# Patient Record
Sex: Male | Born: 1990
Health system: Southern US, Community
[De-identification: ages and names within clinical notes are randomized; demographics above are authoritative.]

## PROBLEM LIST (undated history)

## (undated) DIAGNOSIS — F419 Anxiety disorder, unspecified: Secondary | ICD-10-CM

## (undated) DIAGNOSIS — M1990 Unspecified osteoarthritis, unspecified site: Secondary | ICD-10-CM

## (undated) DIAGNOSIS — T7840XA Allergy, unspecified, initial encounter: Secondary | ICD-10-CM

## (undated) DIAGNOSIS — F32A Depression, unspecified: Secondary | ICD-10-CM

## (undated) DIAGNOSIS — K219 Gastro-esophageal reflux disease without esophagitis: Secondary | ICD-10-CM

## (undated) HISTORY — DX: Anxiety disorder, unspecified: F41.9

## (undated) HISTORY — DX: Unspecified osteoarthritis, unspecified site: M19.90

## (undated) HISTORY — DX: Depression, unspecified: F32.A

## (undated) HISTORY — DX: Gastro-esophageal reflux disease without esophagitis: K21.9

## (undated) HISTORY — DX: Allergy, unspecified, initial encounter: T78.40XA

---
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
## 2008-06-18 ENCOUNTER — Emergency Department (HOSPITAL_COMMUNITY): Admission: EM | Admit: 2008-06-18 | Discharge: 2008-06-18 | Payer: Self-pay | Admitting: Emergency Medicine

## 2011-06-10 LAB — DIFFERENTIAL
Basophils Absolute: 0
Basophils Relative: 0
Eosinophils Absolute: 0.1
Monocytes Relative: 6
Neutro Abs: 4.4
Neutrophils Relative %: 67

## 2011-06-10 LAB — COMPREHENSIVE METABOLIC PANEL
ALT: 16
Alkaline Phosphatase: 54
BUN: 8
CO2: 27
Chloride: 103
Glucose, Bld: 95
Potassium: 4
Sodium: 140
Total Bilirubin: 1.1
Total Protein: 7

## 2011-06-10 LAB — CBC
HCT: 53.5 — ABNORMAL HIGH
Hemoglobin: 17.5 — ABNORMAL HIGH
RBC: 6.09 — ABNORMAL HIGH
RDW: 14.1
WBC: 6.5

## 2011-06-10 LAB — RAPID URINE DRUG SCREEN, HOSP PERFORMED
Cocaine: NOT DETECTED
Tetrahydrocannabinol: POSITIVE — AB

## 2011-06-10 LAB — ETHANOL: Alcohol, Ethyl (B): <5

## 2013-04-26 ENCOUNTER — Encounter (HOSPITAL_BASED_OUTPATIENT_CLINIC_OR_DEPARTMENT_OTHER): Payer: Self-pay | Admitting: *Deleted

## 2013-04-26 ENCOUNTER — Emergency Department (HOSPITAL_BASED_OUTPATIENT_CLINIC_OR_DEPARTMENT_OTHER): Payer: Self-pay

## 2013-04-26 ENCOUNTER — Emergency Department (HOSPITAL_BASED_OUTPATIENT_CLINIC_OR_DEPARTMENT_OTHER)
Admission: EM | Admit: 2013-04-26 | Discharge: 2013-04-26 | Disposition: A | Discharge status: Home / Self Care | Payer: Self-pay | Attending: Emergency Medicine | Admitting: Emergency Medicine

## 2013-04-26 DIAGNOSIS — R209 Unspecified disturbances of skin sensation: Secondary | ICD-10-CM | POA: Insufficient documentation

## 2013-04-26 DIAGNOSIS — F17200 Nicotine dependence, unspecified, uncomplicated: Secondary | ICD-10-CM | POA: Insufficient documentation

## 2013-04-26 DIAGNOSIS — S51009A Unspecified open wound of unspecified elbow, initial encounter: Secondary | ICD-10-CM | POA: Insufficient documentation

## 2013-04-26 DIAGNOSIS — Y92410 Unspecified street and highway as the place of occurrence of the external cause: Secondary | ICD-10-CM | POA: Insufficient documentation

## 2013-04-26 DIAGNOSIS — S51011A Laceration without foreign body of right elbow, initial encounter: Secondary | ICD-10-CM

## 2013-04-26 DIAGNOSIS — IMO0002 Reserved for concepts with insufficient information to code with codable children: Secondary | ICD-10-CM | POA: Insufficient documentation

## 2013-04-26 DIAGNOSIS — Y9389 Activity, other specified: Secondary | ICD-10-CM | POA: Insufficient documentation
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
## 2014-03-12 ENCOUNTER — Emergency Department (HOSPITAL_BASED_OUTPATIENT_CLINIC_OR_DEPARTMENT_OTHER)
Admission: EM | Admit: 2014-03-12 | Discharge: 2014-03-12 | Disposition: A | Discharge status: Home / Self Care | Payer: Self-pay | Attending: Emergency Medicine | Admitting: Emergency Medicine

## 2014-03-12 ENCOUNTER — Encounter (HOSPITAL_BASED_OUTPATIENT_CLINIC_OR_DEPARTMENT_OTHER): Payer: Self-pay | Admitting: Emergency Medicine

## 2014-03-12 ENCOUNTER — Emergency Department (HOSPITAL_BASED_OUTPATIENT_CLINIC_OR_DEPARTMENT_OTHER): Payer: Self-pay

## 2014-03-12 DIAGNOSIS — F17200 Nicotine dependence, unspecified, uncomplicated: Secondary | ICD-10-CM | POA: Insufficient documentation

## 2014-03-12 DIAGNOSIS — J039 Acute tonsillitis, unspecified: Secondary | ICD-10-CM | POA: Insufficient documentation

## 2014-03-12 LAB — RAPID STREP SCREEN (MED CTR MEBANE ONLY): STREPTOCOCCUS, GROUP A SCREEN (DIRECT): NEGATIVE

## 2014-03-12 MED ORDER — PENICILLIN V POTASSIUM 500 MG PO TABS: 500.0000 mg | ORAL_TABLET | Freq: Four times a day (QID) | ORAL | Status: AC

## 2014-03-12 NOTE — ED Provider Notes (Signed)
CSN: [id number]     Arrival date & time 03/12/14  1941 History   First MD Initiated Contact with Patient 03/12/14 2230     Chief Complaint  Patient presents with  . Sore Throat     (Consider location/radiation/quality/duration/timing/severity/associated sxs/prior Treatment) Patient is a 23 y.o. male presenting with pharyngitis. The history is provided by the patient. No language interpreter was used.  Sore Throat This is a new problem. Episode onset: 4 days. The problem occurs constantly. The problem has been gradually worsening. Associated symptoms include a sore throat. Nothing aggravates the symptoms. He has tried nothing for the symptoms. The treatment provided moderate relief.    History reviewed. No pertinent past medical history. History reviewed. No pertinent past surgical history. History reviewed. No pertinent family history. History  Substance Use Topics  . Smoking status: Current Every Day Smoker  . Smokeless tobacco: Not on file  . Alcohol Use: Yes    Review of Systems  HENT: Positive for sore throat.   All other systems reviewed and are negative.     Allergies  Review of patient's allergies indicates no known allergies.  Home Medications   Prior to Admission medications   Not on File   BP 142/63  Pulse 69  Temp(Src) 98.5 F (36.9 C) (Oral)  Resp 18  Ht 6\' 3"  (1.905 m)  Wt 245 lb (111.131 kg)  BMI 30.62 kg/m2  SpO2 100% Physical Exam  Nursing note and vitals reviewed. Constitutional: He is oriented to person, place, and time. He appears well-developed and well-nourished.  HENT:  Head: Normocephalic and atraumatic.  Mouth/Throat: Oropharyngeal exudate present.  Swollen bilat tonsils  Eyes: EOM are normal. Pupils are equal, round, and reactive to light.  Neck: Normal range of motion.  Cardiovascular: Normal rate.   Pulmonary/Chest: Effort normal.  Abdominal: He exhibits no distension.  Musculoskeletal: Normal range of motion.  Neurological: He  is alert and oriented to person, place, and time.  Skin: Skin is warm.  Psychiatric: He has a normal mood and affect.    ED Course  Procedures (including critical care time) Labs Review Labs Reviewed  RAPID STREP SCREEN  CULTURE, GROUP A STREP    Imaging Review Dg Chest 2 View  03/12/2014   CLINICAL DATA:  SORE THROAT  EXAM: CHEST  2 VIEW  COMPARISON:  None.  FINDINGS: The heart size and mediastinal contours are within normal limits. Both lungs are clear. The visualized skeletal structures are unremarkable.  IMPRESSION: No active cardiopulmonary disease.   Electronically Signed   By: Salome HolmesHector  Cooper M.D.   On: 03/12/2014 20:04     EKG Interpretation None      MDM   Final diagnoses:  Tonsillitis    Decadron and pcn     Elson AreasLeslie K Sofia, PA-C 03/12/14 2300  Lonia SkinnerLeslie K CleatonSofia, [place] JerseyPA-C 03/12/14 2300

## 2014-03-12 NOTE — ED Notes (Signed)
Pt reports sore throat, and cough since Thursday.

## 2014-03-12 NOTE — Discharge Instructions (Signed)
Tonsillitis Tonsillitis is an infection of the throat that causes the tonsils to become red, tender, and swollen. Tonsils are collections of lymphoid tissue at the back of the throat. Each tonsil has crevices (crypts). Tonsils help fight nose and throat infections and keep infection from spreading to other parts of the body for the first 18 months of life.  CAUSES Sudden (acute) tonsillitis is usually caused by infection with streptococcal bacteria. Long-lasting (chronic) tonsillitis occurs when the crypts of the tonsils become filled with pieces of food and bacteria, which makes it easy for the tonsils to become repeatedly infected. SYMPTOMS  Symptoms of tonsillitis include:  A sore throat, with possible difficulty swallowing.  White patches on the tonsils.  Fever.  Tiredness.  New episodes of snoring during sleep, when you did not snore before.  Small, foul-smelling, yellowish-white pieces of material (tonsilloliths) that you occasionally cough up or spit out. The tonsilloliths can also cause you to have bad breath. DIAGNOSIS Tonsillitis can be diagnosed through a physical exam. Diagnosis can be confirmed with the results of lab tests, including a throat culture. TREATMENT  The goals of tonsillitis treatment include the reduction of the severity and duration of symptoms and prevention of associated conditions. Symptoms of tonsillitis can be improved with the use of steroids to reduce the swelling. Tonsillitis caused by bacteria can be treated with antibiotic medicines. Usually, treatment with antibiotic medicines is started before the cause of the tonsillitis is known. However, if it is determined that the cause is not bacterial, antibiotic medicines will not treat the tonsillitis. If attacks of tonsillitis are severe and frequent, your health care provider may recommend surgery to remove the tonsils (tonsillectomy). HOME CARE INSTRUCTIONS   Rest as much as possible and get plenty of  sleep.  Drink plenty of fluids. While the throat is very sore, eat soft foods or liquids, such as sherbet, soups, or instant breakfast drinks.  Eat frozen ice pops.  Gargle with a warm or cold liquid to help soothe the throat. Mix 1/4 teaspoon of salt and 1/4 teaspoon of baking soda in in 8 oz of water. SEEK MEDICAL CARE IF:   Large, tender lumps develop in your neck.  A rash develops.  A green, yellow-brown, or bloody substance is coughed up.  You are unable to swallow liquids or food for 24 hours.  You notice that only one of the tonsils is swollen. SEEK IMMEDIATE MEDICAL CARE IF:   You develop any new symptoms such as vomiting, severe headache, stiff neck, chest pain, or trouble breathing or swallowing.  You have severe throat pain along with drooling or voice changes.  You have severe pain, unrelieved with recommended medications.  You are unable to fully open the mouth.  You develop redness, swelling, or severe pain anywhere in the neck.  You have a fever. MAKE SURE YOU:   Understand these instructions.  Will watch your condition.  Will get help right away if you are not doing well or get worse. Document Released: 06/04/2005 Document Revised: 08/30/2013 Document Reviewed: 02/11/2013 Exit[employer] Patient Information 2015 [employer]. This information is not intended to replace advice given to you by your health care provider. Make sure you discuss any questions you have with your health care provider.  

## 2014-03-13 NOTE — ED Provider Notes (Signed)
Medical screening examination/treatment/procedure(s) were performed by non-physician practitioner and as supervising physician I was immediately available for consultation/collaboration.  Shanna CiscoMegan E Docherty, MD 03/13/14 2028

## 2014-03-14 LAB — CULTURE, GROUP A STREP

## 2016-10-29 ENCOUNTER — Encounter (HOSPITAL_BASED_OUTPATIENT_CLINIC_OR_DEPARTMENT_OTHER): Payer: Self-pay

## 2016-10-29 ENCOUNTER — Emergency Department (HOSPITAL_BASED_OUTPATIENT_CLINIC_OR_DEPARTMENT_OTHER): Payer: Worker's Compensation

## 2016-10-29 ENCOUNTER — Emergency Department (HOSPITAL_BASED_OUTPATIENT_CLINIC_OR_DEPARTMENT_OTHER)
Admission: EM | Admit: 2016-10-29 | Discharge: 2016-10-30 | Disposition: A | Discharge status: Home / Self Care | Payer: Worker's Compensation | Attending: Emergency Medicine | Admitting: Emergency Medicine

## 2016-10-29 DIAGNOSIS — W228XXA Striking against or struck by other objects, initial encounter: Secondary | ICD-10-CM | POA: Insufficient documentation

## 2016-10-29 DIAGNOSIS — S0990XA Unspecified injury of head, initial encounter: Secondary | ICD-10-CM | POA: Insufficient documentation

## 2016-10-29 DIAGNOSIS — Y990 Civilian activity done for income or pay: Secondary | ICD-10-CM | POA: Insufficient documentation

## 2016-10-29 DIAGNOSIS — Z87891 Personal history of nicotine dependence: Secondary | ICD-10-CM | POA: Insufficient documentation

## 2016-10-29 DIAGNOSIS — Y9389 Activity, other specified: Secondary | ICD-10-CM | POA: Diagnosis not present

## 2016-10-29 DIAGNOSIS — Y929 Unspecified place or not applicable: Secondary | ICD-10-CM | POA: Insufficient documentation
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
## 2016-10-29 NOTE — ED Triage Notes (Signed)
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

## 2016-10-30 MED ORDER — NAPROXEN 250 MG PO TABS
500.0000 mg | ORAL_TABLET | Freq: Once | ORAL | Status: AC
  Administered 2016-10-30: 500 mg via ORAL
  Filled 2016-10-30: qty 2

## 2017-01-30 ENCOUNTER — Emergency Department (HOSPITAL_BASED_OUTPATIENT_CLINIC_OR_DEPARTMENT_OTHER): Payer: 59

## 2017-01-30 ENCOUNTER — Emergency Department (HOSPITAL_BASED_OUTPATIENT_CLINIC_OR_DEPARTMENT_OTHER)
Admission: EM | Admit: 2017-01-30 | Discharge: 2017-01-30 | Disposition: A | Discharge status: Home / Self Care | Payer: 59 | Attending: Emergency Medicine | Admitting: Emergency Medicine

## 2017-01-30 ENCOUNTER — Encounter (HOSPITAL_BASED_OUTPATIENT_CLINIC_OR_DEPARTMENT_OTHER): Payer: Self-pay

## 2017-01-30 DIAGNOSIS — Y9367 Activity, basketball: Secondary | ICD-10-CM | POA: Diagnosis not present

## 2017-01-30 DIAGNOSIS — M79602 Pain in left arm: Secondary | ICD-10-CM | POA: Diagnosis present

## 2017-01-30 DIAGNOSIS — Z87891 Personal history of nicotine dependence: Secondary | ICD-10-CM | POA: Insufficient documentation

## 2017-01-30 MED ORDER — ACETAMINOPHEN 500 MG PO TABS
1000.0000 mg | ORAL_TABLET | Freq: Once | ORAL | Status: AC
  Administered 2017-01-30: 1000 mg via ORAL
  Filled 2017-01-30: qty 2

## 2017-01-30 MED ORDER — MELOXICAM 15 MG PO TABS: 15.0000 mg | ORAL_TABLET | Freq: Every day | ORAL | 0 refills | Status: DC
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
## 2017-01-30 NOTE — ED Notes (Signed)
Patient transported to X-ray 

## 2017-01-30 NOTE — ED Triage Notes (Signed)
Pt reports he was playing basketball yesterday around 1830 when he fell, resulting in left elbow and left forearm pain. Naproxen taken at 2200, with no relief. CMS intact, area warm and dry, ROM to left elbow is limited.

## 2017-11-08 ENCOUNTER — Emergency Department (HOSPITAL_BASED_OUTPATIENT_CLINIC_OR_DEPARTMENT_OTHER): Payer: 59

## 2017-11-08 ENCOUNTER — Emergency Department (HOSPITAL_BASED_OUTPATIENT_CLINIC_OR_DEPARTMENT_OTHER)
Admission: EM | Admit: 2017-11-08 | Discharge: 2017-11-08 | Disposition: A | Discharge status: Home / Self Care | Payer: 59 | Attending: Emergency Medicine | Admitting: Emergency Medicine

## 2017-11-08 ENCOUNTER — Other Ambulatory Visit: Payer: Self-pay

## 2017-11-08 ENCOUNTER — Encounter (HOSPITAL_BASED_OUTPATIENT_CLINIC_OR_DEPARTMENT_OTHER): Payer: Self-pay | Admitting: Adult Health

## 2017-11-08 DIAGNOSIS — S99911A Unspecified injury of right ankle, initial encounter: Secondary | ICD-10-CM | POA: Diagnosis present

## 2017-11-08 DIAGNOSIS — X503XXA Overexertion from repetitive movements, initial encounter: Secondary | ICD-10-CM | POA: Diagnosis not present

## 2017-11-08 DIAGNOSIS — Z79899 Other long term (current) drug therapy: Secondary | ICD-10-CM | POA: Insufficient documentation

## 2017-11-08 DIAGNOSIS — S93401A Sprain of unspecified ligament of right ankle, initial encounter: Secondary | ICD-10-CM | POA: Diagnosis not present

## 2017-11-08 DIAGNOSIS — Y990 Civilian activity done for income or pay: Secondary | ICD-10-CM | POA: Diagnosis not present

## 2017-11-08 DIAGNOSIS — Y929 Unspecified place or not applicable: Secondary | ICD-10-CM | POA: Insufficient documentation

## 2017-11-08 DIAGNOSIS — Z87891 Personal history of nicotine dependence: Secondary | ICD-10-CM | POA: Diagnosis not present

## 2017-11-08 DIAGNOSIS — Y9389 Activity, other specified: Secondary | ICD-10-CM | POA: Diagnosis not present

## 2017-11-08 MED ORDER — IBUPROFEN 400 MG PO TABS
600.0000 mg | ORAL_TABLET | Freq: Once | ORAL | Status: AC
  Administered 2017-11-08: 600 mg via ORAL
  Filled 2017-11-08: qty 1

## 2017-11-08 NOTE — ED Provider Notes (Signed)
[facility] EMERGENCY DEPARTMENT Provider Note   CSN: [id number] Arrival date & time: 11/08/17  1137     History   Chief Complaint Chief Complaint  Patient presents with  . Ankle Pain    HPI  Roy Morris is a 27 y.o. Male who is otherwise healthy, presents to the ED for evaluation of right ankle pain and swelling since Thursday.  Patient reports he was at work and twisted his ankle while loading a truck, since then he has had pain over the top of the foot and the lateral malleolus.  Pain is constant and aching, no improvement since onset, does report some slight improvement in swelling.  He reports pain primarily with weightbearing and movement, patient has not taken anything for the pain prior to arrival, no other alleviating or aggravating factors.  No numbness or tingling, no pain at the knee.  He has not been using ice or elevation at all.      History reviewed. No pertinent past medical history.  There are no active problems to display for this patient.   History reviewed. No pertinent surgical history.     Home Medications    Prior to Admission medications   Medication Sig Start Date End Date Taking? Authorizing Provider  meloxicam (MOBIC) 15 MG tablet Take 1 tablet (15 mg total) by mouth daily. 01/30/17   Palumbo, April, MD    Family History History reviewed. No pertinent family history.  Social History Social History   Tobacco Use  . Smoking status: Former Games developermoker  . Smokeless tobacco: Never Used  Substance Use Topics  . Alcohol use: Yes    Comment: occ  . Drug use: No     Allergies   Patient has no known allergies.   Review of Systems Review of Systems  Constitutional: Negative for chills and fever.  Musculoskeletal: Positive for arthralgias and joint swelling.  Skin: Negative for color change, rash and wound.  Neurological: Negative for weakness and numbness.     Physical Exam Updated Vital Signs BP 129/69 (BP Location: Right  Arm)   Pulse (!) 58   Temp 98.3 F (36.8 C) (Oral)   Resp 18   Ht 6\' 3"  (1.905 m)   Wt 113.2 kg (249 lb 9 oz)   SpO2 100%   BMI 31.19 kg/m   Physical Exam  Constitutional: He appears well-developed and well-nourished. No distress.  HENT:  Head: Normocephalic and atraumatic.  Eyes: Right eye exhibits no discharge. Left eye exhibits no discharge.  Pulmonary/Chest: Effort normal. No respiratory distress.  Musculoskeletal:  Mild swelling noted over the top of the foot and the lateral malleolus, tenderness to palpation in the distribution as well no erythema or warmth, range of motion intact with some discomfort, 5/5 strength with dorsi and plantar flexion, 2+ DP and TP pulses with good capillary refill and sensation intact, normal knee exam.  Neurological: He is alert. Coordination normal.  Skin: Skin is warm and dry. Capillary refill takes less than 2 seconds. He is not diaphoretic.  Psychiatric: He has a normal mood and affect. His behavior is normal.  Nursing note and vitals reviewed.    ED Treatments / Results  Labs (all labs ordered are listed, but only abnormal results are displayed) Labs Reviewed - No data to display  EKG  EKG Interpretation None       Radiology Dg Ankle Complete Right  Result Date: 11/08/2017 CLINICAL DATA:  Ankle pain, status post injury EXAM: RIGHT ANKLE - COMPLETE 3+  VIEW COMPARISON:  None. FINDINGS: There is no evidence of fracture, dislocation, or joint effusion. There is no evidence of arthropathy or other focal bone abnormality. Soft tissues are unremarkable. IMPRESSION: No acute osseous injury of the right ankle. Electronically Signed   By: Elige Ko   On: 11/08/2017 12:02    Procedures Procedures (including critical care time)  Medications Ordered in ED Medications - No data to display   Initial Impression / Assessment and Plan / ED Course  I have reviewed the triage vital signs and the nursing notes.  Pertinent labs & imaging  results that were available during my care of the patient were reviewed by me and considered in my medical decision making (see chart for details).  Presentation consistent with ankle sprain. Tenderness and swelling over medial malleolus, pt is neurovascularly intact, and x-ray negative for fracture, and shows ankle mortise is intact. Pain treated in the ED. Pt placed in ASO brace and provided crutches, ambulated without difficulty. Pt stable for discharge home with ibuprofen for pain. Pt to follow-up with ortho in one week if symptoms not improving. Return precautions discussed, Pt expresses understanding and agrees with plan.   Final Clinical Impressions(s) / ED Diagnoses   Final diagnoses:  Sprain of right ankle, unspecified ligament, initial encounter    ED Discharge Orders    None       Dartha Lodge, [place] 11/08/17 1223    Rolan Bucco, MD 11/08/17 [phone]

## 2017-11-08 NOTE — ED Triage Notes (Signed)
Presents with right ankle pain and swelling since Thursday. Not doing RICE at home. RIGHt ankle twisted while working. Swelling noted. No medications. CMS intact.

## 2017-11-08 NOTE — Discharge Instructions (Signed)
You have an ankle sprain, x-ray shows no evidence of fracture.  Please use ibuprofen and Tylenol, elevate and ice the ankle, use ankle brace and crutches, the more you stay off of the ankle to sooner will heal.  If not improving you may follow-up with Dr. Norton BlizzardShane Hudnall with sports medicine.  If you have severe increased pain in the ankle, swelling, redness, warmth fevers or chills or other new or concerning symptoms he may return to the emergency department for reevaluation.

## 2018-01-07 ENCOUNTER — Encounter (HOSPITAL_BASED_OUTPATIENT_CLINIC_OR_DEPARTMENT_OTHER): Payer: Self-pay | Admitting: *Deleted

## 2018-01-07 ENCOUNTER — Emergency Department (HOSPITAL_BASED_OUTPATIENT_CLINIC_OR_DEPARTMENT_OTHER)
Admission: EM | Admit: 2018-01-07 | Discharge: 2018-01-07 | Disposition: A | Discharge status: Home / Self Care | Payer: 59 | Attending: Emergency Medicine | Admitting: Emergency Medicine

## 2018-01-07 ENCOUNTER — Other Ambulatory Visit: Payer: Self-pay

## 2018-01-07 DIAGNOSIS — L0231 Cutaneous abscess of buttock: Secondary | ICD-10-CM | POA: Insufficient documentation

## 2018-01-07 DIAGNOSIS — Z79899 Other long term (current) drug therapy: Secondary | ICD-10-CM | POA: Insufficient documentation

## 2018-01-07 DIAGNOSIS — Z87891 Personal history of nicotine dependence: Secondary | ICD-10-CM | POA: Insufficient documentation

## 2018-01-07 DIAGNOSIS — Z5321 Procedure and treatment not carried out due to patient leaving prior to being seen by health care provider: Secondary | ICD-10-CM | POA: Insufficient documentation

## 2018-01-07 MED ORDER — OXYCODONE-ACETAMINOPHEN 5-325 MG PO TABS
1.0000 | ORAL_TABLET | Freq: Once | ORAL | Status: AC
  Administered 2018-01-07: 1 via ORAL
  Filled 2018-01-07: qty 1

## 2018-01-07 MED ORDER — LIDOCAINE-EPINEPHRINE (PF) 2 %-1:200000 IJ SOLN
10.0000 mL | Freq: Once | INTRAMUSCULAR | Status: AC
  Administered 2018-01-07: 10 mL
  Filled 2018-01-07: qty 10

## 2018-01-07 MED ORDER — SULFAMETHOXAZOLE-TRIMETHOPRIM 800-160 MG PO TABS: 1.0000 | ORAL_TABLET | Freq: Two times a day (BID) | ORAL | 0 refills | Status: AC

## 2018-01-07 MED ORDER — SULFAMETHOXAZOLE-TRIMETHOPRIM 800-160 MG PO TABS
1.0000 | ORAL_TABLET | Freq: Once | ORAL | Status: AC
  Administered 2018-01-07: 1 via ORAL
  Filled 2018-01-07: qty 1

## 2018-01-07 NOTE — ED Triage Notes (Signed)
Abscess. Possible pilonidal cyst.

## 2018-01-07 NOTE — Discharge Instructions (Signed)
Please read and follow all provided instructions.  Your diagnoses today include:  1. Abscess of buttock, right    Tests performed today include:  Vital signs. See below for your results today.   Medications prescribed:   Bactrim (trimethoprim/sulfamethoxazole) - antibiotic  You have been prescribed an antibiotic medicine: take the entire course of medicine even if you are feeling better. Stopping early can cause the antibiotic not to work.  Take any prescribed medications only as directed.   Home care instructions:   Follow any educational materials contained in this packet  Remove packing at home in 48 hours by gently pulling on it.   Follow-up instructions: Return to the Emergency Department in 48 hours for a recheck if your symptoms are not significantly improved.  Return instructions:  Return to the Emergency Department if you have:  Fever  Worsening symptoms  Worsening pain  Worsening swelling  Redness of the skin that moves away from the affected area, especially if it streaks away from the affected area   Any other emergent concerns  Your vital signs today were: BP (!) 120/57 (BP Location: Left Arm)    Pulse 60    Temp 99.6 F (37.6 C) (Oral)    Resp 16    Ht  (1.905 m)    Wt 112.9 kg (249 lb)    SpO2 100%    BMI 31.12 kg/m  If your blood pressure (BP) was elevated above 135/85 this visit, please have this repeated by your doctor within one month. --------------

## 2018-01-07 NOTE — ED Provider Notes (Signed)
[facility] EMERGENCY DEPARTMENT Provider Note   CSN: [id number] Arrival date & time: 01/07/18  1653     History   Chief Complaint Chief Complaint  Patient presents with  . Abscess    HPI Roy Morris is a 27 y.o. male.  Patient presents the emergency department with complaint of buttocks abscess, worsening over the past several days.  No drainage.  No history of the same.  Area is very tender and hurts worse with pressure and palpation.  No pain with bowel movements.  No urinary symptoms or abdominal pain.  Yesterday the patient was working in a warm environment and had lightheadedness and dizziness as well as several episodes of vomiting.  Patient still feels weak but other symptoms have improved.  No associated diarrhea.  No treatments prior to arrival.  Onset acute.     History reviewed. No pertinent past medical history.  There are no active problems to display for this patient.   History reviewed. No pertinent surgical history.      Home Medications    Prior to Admission medications   Medication Sig Start Date End Date Taking? Authorizing Provider  meloxicam (MOBIC) 15 MG tablet Take 1 tablet (15 mg total) by mouth daily. 01/30/17   Palumbo, April, MD    Family History No family history on file.  Social History Social History   Tobacco Use  . Smoking status: Former Games developer  . Smokeless tobacco: Never Used  Substance Use Topics  . Alcohol use: Yes    Comment: occ  . Drug use: No     Allergies   Patient has no known allergies.   Review of Systems Review of Systems  Constitutional: Positive for fatigue. Negative for fever.  Gastrointestinal: Negative for abdominal pain, diarrhea, nausea and vomiting (yesterday).  Skin: Negative for color change.       Positive for abscess  Neurological: Negative for dizziness (yesteryday, resolved).  Hematological: Negative for adenopathy.     Physical Exam Updated Vital Signs BP (!) 123/48   Pulse 68    Temp 99.6 F (37.6 C) (Oral)   Resp 20   Ht  (1.905 m)   Wt 112.9 kg (249 lb)   SpO2 100%   BMI 31.12 kg/m   Physical Exam  Constitutional: He appears well-developed and well-nourished.  HENT:  Head: Normocephalic and atraumatic.  Mouth/Throat: Oropharynx is clear and moist.  Eyes: Conjunctivae are normal. Right eye exhibits no discharge. Left eye exhibits no discharge.  Neck: Normal range of motion. Neck supple.  Cardiovascular: Normal rate, regular rhythm and normal heart sounds.  Pulmonary/Chest: Effort normal and breath sounds normal.  Abdominal: Soft. There is no tenderness. There is no rebound and no guarding.  Neurological: He is alert.  Skin: Skin is warm and dry.  4cm x 4cm induration to the medial right buttock with severe tenderness to palpation.  There is some associated cellulitic changes.  Psychiatric: He has a normal mood and affect.  Nursing note and vitals reviewed.    ED Treatments / Results  Labs (all labs ordered are listed, but only abnormal results are displayed) Labs Reviewed - No data to display  EKG None  Radiology No results found.  Procedures .Marland KitchenIncision and Drainage Date/Time: 01/07/2018 8:23 PM Performed by: Renne Crigler, PA-C Authorized by: Renne Crigler, PA-C   Consent:    Consent obtained:  Verbal   Consent given by:  Patient   Risks discussed:  Incomplete drainage, pain and infection   Alternatives  discussed:  No treatment Location:    Type:  Abscess   Size:  4cm x 4cm    Location:  Anogenital   Anogenital location: buttock. Pre-procedure details:    Skin preparation:  Betadine Anesthesia (see MAR for exact dosages):    Anesthesia method:  Local infiltration   Local anesthetic:  Lidocaine 2% WITH epi Procedure type:    Complexity:  Simple Procedure details:    Needle aspiration: no     Incision types:  Stab incision   Scalpel blade:  11   Wound management:  Probed and deloculated   Drainage:  Bloody and  purulent   Drainage amount:  Copious   Packing materials:  1/4 in gauze Post-procedure details:    Patient tolerance of procedure:  Tolerated well, no immediate complications   (including critical care time)  Medications Ordered in ED Medications  oxyCODONE-acetaminophen (PERCOCET/ROXICET) 5-325 MG per tablet 1 tablet (1 tablet Oral Given 01/07/18 1929)  sulfamethoxazole-trimethoprim (BACTRIM DS,SEPTRA DS) 800-160 MG per tablet 1 tablet (1 tablet Oral Given 01/07/18 1929)  lidocaine-EPINEPHrine (XYLOCAINE W/EPI) 2 %-1:200000 (PF) injection 10 mL (10 mLs Infiltration Given 01/07/18 1929)     Initial Impression / Assessment and Plan / ED Course  I have reviewed the triage vital signs and the nursing notes.  Pertinent labs & imaging results that were available during my care of the patient were reviewed by me and considered in my medical decision making (see chart for details).     Patient seen and examined. Medications ordered.   Vital signs reviewed and are as follows: BP (!) 123/48   Pulse 68   Temp 99.6 F (37.6 C) (Oral)   Resp 20   Ht  (1.905 m)   Wt 112.9 kg (249 lb)   SpO2 100%   BMI 31.12 kg/m   I&D performed.   8:32 PM The patient was urged to return to the Emergency Department urgently with worsening pain, swelling, expanding erythema especially if it streaks away from the affected area, fever, or if they have any other concerns.   The patient was instructed to remove packing at home in 48 hours and return to the Emergency Department or go to their PCP at that time for a recheck if their symptoms are not entirely resolved.  The patient verbalized understanding and stated agreement with this plan.     Final Clinical Impressions(s) / ED Diagnoses   Final diagnoses:  Abscess of buttock, right   Patient with abscess of buttocks.  I&D performed.  Antibiotics prescribed.  No systemic symptoms of illness.  Patient counseled on signs and symptoms to return.  ED  Discharge Orders        Ordered    sulfamethoxazole-trimethoprim (BACTRIM DS,SEPTRA DS) 800-160 MG tablet  2 times daily     01/07/18 2018       Renne Crigler, Cordelia Poche 01/07/18 2033    Tegeler, Canary Brim, MD 01/08/18 [phone]

## 2018-01-07 NOTE — ED Triage Notes (Signed)
He was here earlier but had to leave. Here for abscess. Possible pilonidal cyst.

## 2018-05-23 ENCOUNTER — Emergency Department (HOSPITAL_BASED_OUTPATIENT_CLINIC_OR_DEPARTMENT_OTHER)
Admission: EM | Admit: 2018-05-23 | Discharge: 2018-05-23 | Disposition: A | Discharge status: Home / Self Care | Payer: 59 | Attending: Emergency Medicine | Admitting: Emergency Medicine

## 2018-05-23 ENCOUNTER — Other Ambulatory Visit: Payer: Self-pay

## 2018-05-23 ENCOUNTER — Emergency Department (HOSPITAL_BASED_OUTPATIENT_CLINIC_OR_DEPARTMENT_OTHER): Payer: 59

## 2018-05-23 ENCOUNTER — Encounter (HOSPITAL_BASED_OUTPATIENT_CLINIC_OR_DEPARTMENT_OTHER): Payer: Self-pay | Admitting: Adult Health

## 2018-05-23 DIAGNOSIS — Z79899 Other long term (current) drug therapy: Secondary | ICD-10-CM | POA: Diagnosis not present

## 2018-05-23 DIAGNOSIS — R05 Cough: Secondary | ICD-10-CM | POA: Diagnosis present

## 2018-05-23 DIAGNOSIS — J40 Bronchitis, not specified as acute or chronic: Secondary | ICD-10-CM

## 2018-05-23 DIAGNOSIS — Z87891 Personal history of nicotine dependence: Secondary | ICD-10-CM | POA: Diagnosis not present

## 2018-05-23 DIAGNOSIS — J4540 Moderate persistent asthma, uncomplicated: Secondary | ICD-10-CM | POA: Insufficient documentation

## 2018-05-23 MED ORDER — PREDNISONE 10 MG PO TABS: 40.0000 mg | ORAL_TABLET | Freq: Every day | ORAL | 0 refills | Status: DC

## 2018-05-23 MED ORDER — IPRATROPIUM-ALBUTEROL 0.5-2.5 (3) MG/3ML IN SOLN
3.0000 mL | Freq: Four times a day (QID) | RESPIRATORY_TRACT | Status: DC
  Administered 2018-05-23: 3 mL via RESPIRATORY_TRACT
  Filled 2018-05-23 (×2): qty 3

## 2018-05-23 MED ORDER — PREDNISONE 50 MG PO TABS
60.0000 mg | ORAL_TABLET | Freq: Once | ORAL | Status: AC
  Administered 2018-05-23: 60 mg via ORAL
  Filled 2018-05-23: qty 1

## 2018-05-23 MED ORDER — ALBUTEROL SULFATE HFA 108 (90 BASE) MCG/ACT IN AERS
2.0000 | INHALATION_SPRAY | Freq: Four times a day (QID) | RESPIRATORY_TRACT | Status: DC
  Administered 2018-05-23: 2 via RESPIRATORY_TRACT
  Filled 2018-05-23: qty 6.7

## 2018-05-23 NOTE — ED Triage Notes (Signed)
Presents with one month of cough, sinus drainage, post nasal drip, and facial pain. Sudafed is not helping. Cough is productive  And described as yellow. Denies fevers. Wife has same. Breath sounds clear

## 2018-05-23 NOTE — ED Provider Notes (Signed)
[facility] EMERGENCY DEPARTMENT Provider Note   CSN: [id number] Arrival date & time: 05/23/18  0944     History   Chief Complaint Chief Complaint  Patient presents with  . Cough    HPI Roy GarfinkelRyan Morris is a 27 y.o. male.  Patient is a smoker.  Patient with a one-month history of cough occasionally productive.  Feeling short of breath.  Some sinus drainage but not a lot.  Cough is yellow and is productive.  States that his wife has same thing.  Patient denies using e-cigarettes.  No fevers.  No history of asthma.     History reviewed. No pertinent past medical history.  There are no active problems to display for this patient.   History reviewed. No pertinent surgical history.      Home Medications    Prior to Admission medications   Medication Sig Start Date End Date Taking? Authorizing Provider  meloxicam (MOBIC) 15 MG tablet Take 1 tablet (15 mg total) by mouth daily. 01/30/17   Palumbo, April, MD  predniSONE (DELTASONE) 10 MG tablet Take 4 tablets (40 mg total) by mouth daily. 05/23/18   Vanetta MuldersZackowski, Emmett Bracknell, MD    Family History History reviewed. No pertinent family history.  Social History Social History   Tobacco Use  . Smoking status: Former Games developermoker  . Smokeless tobacco: Never Used  Substance Use Topics  . Alcohol use: Yes    Comment: occ  . Drug use: No     Allergies   Patient has no known allergies.   Review of Systems Review of Systems  HENT: Positive for congestion and sinus pressure. Negative for sore throat.   Eyes: Negative for redness.  Respiratory: Positive for cough and shortness of breath.   Gastrointestinal: Negative for abdominal pain, nausea and vomiting.  Genitourinary: Negative for dysuria.  Musculoskeletal: Negative for back pain.  Skin: Negative for rash.  Neurological: Negative for headaches.  Hematological: Does not bruise/bleed easily.  Psychiatric/Behavioral: Negative for confusion.     Physical Exam Updated  Vital Signs BP 126/79 (BP Location: Right Arm)   Pulse 64   Temp 97.8 F (36.6 C) (Oral)   Resp 16   Ht 1.905 m (6\' 3" )   Wt 111.1 kg   SpO2 100%   BMI 30.62 kg/m   Physical Exam  Constitutional: He is oriented to person, place, and time. He appears well-developed and well-nourished. No distress.  HENT:  Head: Normocephalic and atraumatic.  Mouth/Throat: Oropharynx is clear and moist. No oropharyngeal exudate.  Eyes: Pupils are equal, round, and reactive to light. EOM are normal.  Neck: Normal range of motion. Neck supple.  Cardiovascular: Normal rate, regular rhythm and normal heart sounds.  Pulmonary/Chest: Effort normal. He has wheezes.  Abdominal: Soft. Bowel sounds are normal. There is no tenderness.  Musculoskeletal: Normal range of motion.  Neurological: He is alert and oriented to person, place, and time. No cranial nerve deficit. He exhibits normal muscle tone. Coordination normal.  Skin: Skin is warm. No rash noted.  Nursing note and vitals reviewed.    ED Treatments / Results  Labs (all labs ordered are listed, but only abnormal results are displayed) Labs Reviewed - No data to display  EKG None  Radiology Dg Chest 2 View  Result Date: 05/23/2018 CLINICAL DATA:  One month of cough, sinus drainage, postnasal drip and facial pain. Productive cough. EXAM: CHEST - 2 VIEW COMPARISON:  Chest x-ray dated 03/12/2014. FINDINGS: The heart size and mediastinal contours are within normal  limits. Both lungs are clear. The visualized skeletal structures are unremarkable. IMPRESSION: No active cardiopulmonary disease.  No evidence of pneumonia. Electronically Signed   By: Bary Richard M.D.   On: 05/23/2018 10:51    Procedures Procedures (including critical care time)  Medications Ordered in ED Medications  ipratropium-albuterol (DUONEB) 0.5-2.5 (3) MG/3ML nebulizer solution 3 mL (3 mLs Nebulization Given 05/23/18 1048)  albuterol (PROVENTIL HFA;VENTOLIN HFA) 108 (90 Base)  MCG/ACT inhaler 2 puff (2 puffs Inhalation Given 05/23/18 1245)  predniSONE (DELTASONE) tablet 60 mg (60 mg Oral Given 05/23/18 1246)     Initial Impression / Assessment and Plan / ED Course  I have reviewed the triage vital signs and the nursing notes.  Pertinent labs & imaging results that were available during my care of the patient were reviewed by me and considered in my medical decision making (see chart for details).    Not patient on initial exam with wheezing throughout.  Oxygen saturations were fine at 100% not tachypneic or any retractions.  Patient treated with albuterol Atrovent nebulizer and wheezing resolved.  Patient felt somewhat improved.  Patient symptoms overall for the past month consistent with bronchitis but there appears to be some reactive airway disease going on as well.  Patient be treated with prednisone and also started on an inhaler.  Inhaler be provided here and patient will be taught how to use it.  Patient given referral for follow-up with the wellness clinic for further evaluation.   Final Clinical Impressions(s) / ED Diagnoses   Final diagnoses:  Bronchitis  Moderate persistent reactive airway disease without complication    ED Discharge Orders         Ordered    predniSONE (DELTASONE) 10 MG tablet  Daily     05/23/18 1303           Vanetta Mulders, MD 05/23/18 1318

## 2018-05-23 NOTE — Discharge Instructions (Signed)
Take the prednisone as directed for the next 5 days.  Use the albuterol inhaler every 6 hours for at least the next 2 weeks.  Make an appointment to follow-up with the wellness clinic.  Work note provided.

## 2018-05-26 ENCOUNTER — Emergency Department (HOSPITAL_BASED_OUTPATIENT_CLINIC_OR_DEPARTMENT_OTHER)
Admission: EM | Admit: 2018-05-26 | Discharge: 2018-05-26 | Disposition: A | Discharge status: Home / Self Care | Payer: 59 | Attending: Emergency Medicine | Admitting: Emergency Medicine

## 2018-05-26 ENCOUNTER — Other Ambulatory Visit: Payer: Self-pay

## 2018-05-26 ENCOUNTER — Emergency Department (HOSPITAL_BASED_OUTPATIENT_CLINIC_OR_DEPARTMENT_OTHER): Payer: 59

## 2018-05-26 DIAGNOSIS — Z87891 Personal history of nicotine dependence: Secondary | ICD-10-CM | POA: Insufficient documentation

## 2018-05-26 DIAGNOSIS — Z79899 Other long term (current) drug therapy: Secondary | ICD-10-CM | POA: Insufficient documentation

## 2018-05-26 DIAGNOSIS — J069 Acute upper respiratory infection, unspecified: Secondary | ICD-10-CM | POA: Diagnosis not present

## 2018-05-26 DIAGNOSIS — R05 Cough: Secondary | ICD-10-CM | POA: Diagnosis present

## 2018-05-26 DIAGNOSIS — R1110 Vomiting, unspecified: Secondary | ICD-10-CM

## 2018-05-26 DIAGNOSIS — B9789 Other viral agents as the cause of diseases classified elsewhere: Secondary | ICD-10-CM

## 2018-05-26 MED ORDER — AZITHROMYCIN 250 MG PO TABS: 250.0000 mg | ORAL_TABLET | Freq: Every day | ORAL | 0 refills | Status: DC

## 2018-05-26 MED ORDER — BENZONATATE 100 MG PO CAPS: 100.0000 mg | ORAL_CAPSULE | Freq: Three times a day (TID) | ORAL | 0 refills | Status: DC

## 2018-05-26 NOTE — ED Provider Notes (Signed)
[facility] EMERGENCY DEPARTMENT Provider Note   CSN: [id number] Arrival date & time: 05/26/18  [id number]     History   Chief Complaint Chief Complaint  Patient presents with  . Cough  . Shortness of Breath    HPI Roy Morris is a 27 y.o. male.  27 yo M with a chief complaint of a week of cough and congestion.  He thinks it is slightly been worsening.  Starting to have pain now with coughing and coughing so hard that he ends up vomiting.  He denies any sick contacts denies fevers denies abdominal pain.  The history is provided by the patient.  Cough  Associated symptoms include chest pain (with coughing) and shortness of breath. Pertinent negatives include no chills, no headaches and no myalgias.  Shortness of Breath  Associated symptoms include cough, chest pain (with coughing) and vomiting (post tussive). Pertinent negatives include no fever, no headaches, no abdominal pain and no rash.  Illness  This is a new problem. The current episode started 2 days ago. The problem occurs constantly. The problem has been gradually worsening. Associated symptoms include chest pain (with coughing) and shortness of breath. Pertinent negatives include no abdominal pain and no headaches. The symptoms are aggravated by coughing. Nothing relieves the symptoms. He has tried nothing for the symptoms. The treatment provided no relief.    No past medical history on file.  There are no active problems to display for this patient.   No past surgical history on file.      Home Medications    Prior to Admission medications   Medication Sig Start Date End Date Taking? Authorizing Provider  azithromycin (ZITHROMAX) 250 MG tablet Take 1 tablet (250 mg total) by mouth daily. Take first 2 tablets together, then 1 every day until finished. 05/26/18   Melene PlanFloyd, Kriste Broman, DO  benzonatate (TESSALON) 100 MG capsule Take 1 capsule (100 mg total) by mouth every 8 (eight) hours. 05/26/18   Melene PlanFloyd, Aryssa Rosamond, DO    meloxicam (MOBIC) 15 MG tablet Take 1 tablet (15 mg total) by mouth daily. 01/30/17   Palumbo, April, MD  predniSONE (DELTASONE) 10 MG tablet Take 4 tablets (40 mg total) by mouth daily. 05/23/18   Vanetta MuldersZackowski, Scott, MD    Family History No family history on file.  Social History Social History   Tobacco Use  . Smoking status: Former Games developermoker  . Smokeless tobacco: Never Used  Substance Use Topics  . Alcohol use: Yes    Comment: occ  . Drug use: No     Allergies   Patient has no known allergies.   Review of Systems Review of Systems  Constitutional: Negative for chills and fever.  HENT: Negative for congestion and facial swelling.   Eyes: Negative for discharge and visual disturbance.  Respiratory: Positive for cough and shortness of breath.   Cardiovascular: Positive for chest pain (with coughing). Negative for palpitations.  Gastrointestinal: Positive for vomiting (post tussive). Negative for abdominal pain and diarrhea.  Musculoskeletal: Negative for arthralgias and myalgias.  Skin: Negative for color change and rash.  Neurological: Negative for tremors, syncope and headaches.  Psychiatric/Behavioral: Negative for confusion and dysphoric mood.     Physical Exam Updated Vital Signs BP 131/65 (BP Location: Right Arm)   Pulse (!) 53   Temp (!) 97.4 F (36.3 C) (Oral)   Resp 14   Ht 6\' 3"  (1.905 m)   Wt 106.6 kg   SpO2 100%   BMI 29.37 kg/m  Physical Exam  Constitutional: He is oriented to person, place, and time. He appears well-developed and well-nourished.  HENT:  Head: Normocephalic and atraumatic.  Swollen turbinates, posterior nasal drip, no noted sinus ttp, tm normal bilaterally.    Eyes: Pupils are equal, round, and reactive to light. EOM are normal.  Neck: Normal range of motion. Neck supple. No JVD present.  Cardiovascular: Normal rate and regular rhythm. Exam reveals no gallop and no friction rub.  No murmur heard. Pulmonary/Chest: No respiratory  distress. He has no wheezes. He has rhonchi in the right lower field.  Abdominal: He exhibits no distension. There is no rebound and no guarding.  Musculoskeletal: Normal range of motion.  Neurological: He is alert and oriented to person, place, and time.  Skin: No rash noted. No pallor.  Psychiatric: He has a normal mood and affect. His behavior is normal.  Nursing note and vitals reviewed.    ED Treatments / Results  Labs (all labs ordered are listed, but only abnormal results are displayed) Labs Reviewed - No data to display  EKG None  Radiology Dg Chest 2 View  Result Date: 05/26/2018 CLINICAL DATA:  Cough and chest pain and shortness of breath. EXAM: CHEST - 2 VIEW COMPARISON:  05/23/2018 FINDINGS: The heart size and mediastinal contours are within normal limits. Both lungs are clear except for slight peribronchial thickening. No effusions the visualized skeletal structures are unremarkable. IMPRESSION: Slight bronchitic changes. Electronically Signed   By: Francene Boyers M.D.   On: 05/26/2018 10:13    Procedures Procedures (including critical care time)  Medications Ordered in ED Medications - No data to display   Initial Impression / Assessment and Plan / ED Course  I have reviewed the triage vital signs and the nursing notes.  Pertinent labs & imaging results that were available during my care of the patient were reviewed by me and considered in my medical decision making (see chart for details).     27 yo M with a chief complaint of URI-like symptoms.  Going on for the past week.  Patient was seen initially and diagnosed with bronchitis and had some improvement with inhalers and was started on steroids.  Since then his symptoms have gotten worse where he has been coughing so hard that he is vomited.  I will treat him presumptively for pertussis.  Chest x-ray here without focal infiltrate as viewed by me.  Have him follow-up with his PCP.  10:21 AM:  I have discussed  the diagnosis/risks/treatment options with the patient and believe the pt to be eligible for discharge home to follow-up with PCP. We also discussed returning to the ED immediately if new or worsening sx occur. We discussed the sx which are most concerning (e.g., sudden worsening sob, fever) that necessitate immediate return. Medications administered to the patient during their visit and any new prescriptions provided to the patient are listed below.  Medications given during this visit Medications - No data to display   The patient appears reasonably screen and/or stabilized for discharge and I doubt any other medical condition or other [facility] requiring further screening, evaluation, or treatment in the ED at this time prior to discharge.    Final Clinical Impressions(s) / ED Diagnoses   Final diagnoses:  Viral URI with cough  Post-tussive emesis    ED Discharge Orders         Ordered    azithromycin (ZITHROMAX) 250 MG tablet  Daily     05/26/18 1018  benzonatate (TESSALON) 100 MG capsule  Every 8 hours     05/26/18 1018           Melene Plan, DO 05/26/18 1021

## 2018-05-26 NOTE — ED Notes (Signed)
Pt/family verbalized understanding of discharge instructions.   

## 2018-05-26 NOTE — Discharge Instructions (Signed)
Take tylenol 2 pills 4 times a day and motrin 4 pills 3 times a day.  Drink plenty of fluids.  Return for worsening shortness of breath, headache, confusion. Follow up with your family doctor.   

## 2018-05-26 NOTE — ED Triage Notes (Signed)
Pt reports cough and shortness of breath, chest pain when coughing. Was seen 3 days ago and was given inhaler . Feels no relief. Nasal congestion. Alert and oriented x 4.

## 2018-05-26 NOTE — ED Notes (Signed)
Patient transported to X-ray 
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
## 2018-09-02 ENCOUNTER — Emergency Department (HOSPITAL_BASED_OUTPATIENT_CLINIC_OR_DEPARTMENT_OTHER)
Admission: EM | Admit: 2018-09-02 | Discharge: 2018-09-02 | Disposition: A | Discharge status: Home / Self Care | Payer: 59 | Attending: Emergency Medicine | Admitting: Emergency Medicine

## 2018-09-02 ENCOUNTER — Other Ambulatory Visit: Payer: Self-pay

## 2018-09-02 ENCOUNTER — Encounter (HOSPITAL_BASED_OUTPATIENT_CLINIC_OR_DEPARTMENT_OTHER): Payer: Self-pay | Admitting: Emergency Medicine

## 2018-09-02 DIAGNOSIS — R197 Diarrhea, unspecified: Secondary | ICD-10-CM | POA: Diagnosis not present

## 2018-09-02 DIAGNOSIS — Z79899 Other long term (current) drug therapy: Secondary | ICD-10-CM | POA: Diagnosis not present

## 2018-09-02 DIAGNOSIS — R112 Nausea with vomiting, unspecified: Secondary | ICD-10-CM | POA: Insufficient documentation

## 2018-09-02 DIAGNOSIS — Z87891 Personal history of nicotine dependence: Secondary | ICD-10-CM | POA: Insufficient documentation

## 2018-09-02 MED ORDER — ONDANSETRON 4 MG PO TBDP
4.0000 mg | ORAL_TABLET | Freq: Once | ORAL | Status: AC
  Administered 2018-09-02: 4 mg via ORAL
  Filled 2018-09-02: qty 1

## 2018-09-02 MED ORDER — ONDANSETRON HCL 4 MG PO TABS: 4.0000 mg | ORAL_TABLET | Freq: Three times a day (TID) | ORAL | 0 refills | Status: AC | PRN

## 2018-09-02 NOTE — ED Provider Notes (Signed)
[facility] EMERGENCY DEPARTMENT Provider Note   CSN: [id number] Arrival date & time: 09/02/18  1108     History   Chief Complaint Chief Complaint  Patient presents with  . Emesis  . Diarrhea    HPI Roy Morris is a 27 y.o. male.  The history is provided by the patient.  Diarrhea   This is a new problem. The current episode started more than 2 days ago. The problem occurs 2 to 4 times per day. The problem has been rapidly improving. The stool consistency is described as watery. There has been no fever. The fever has been present for less than 1 day. Associated symptoms include vomiting. Pertinent negatives include no abdominal pain, no chills, no arthralgias and no cough. He has tried nothing for the symptoms. The treatment provided no relief. Risk factors include ill contacts.    History reviewed. No pertinent past medical history.  There are no active problems to display for this patient.   History reviewed. No pertinent surgical history.      Home Medications    Prior to Admission medications   Medication Sig Start Date End Date Taking? Authorizing Provider  azithromycin (ZITHROMAX) 250 MG tablet Take 1 tablet (250 mg total) by mouth daily. Take first 2 tablets together, then 1 every day until finished. 05/26/18   Melene PlanFloyd, Dan, DO  benzonatate (TESSALON) 100 MG capsule Take 1 capsule (100 mg total) by mouth every 8 (eight) hours. 05/26/18   Melene PlanFloyd, Dan, DO  meloxicam (MOBIC) 15 MG tablet Take 1 tablet (15 mg total) by mouth daily. 01/30/17   Palumbo, April, MD  ondansetron (ZOFRAN) 4 MG tablet Take 1 tablet (4 mg total) by mouth every 8 (eight) hours as needed for up to 12 days for nausea or vomiting. 09/02/18 09/14/18  Tayna Smethurst, DO  predniSONE (DELTASONE) 10 MG tablet Take 4 tablets (40 mg total) by mouth daily. 05/23/18   Vanetta MuldersZackowski, Scott, MD    Family History No family history on file.  Social History Social History   Tobacco Use  . Smoking status:  Former Games developermoker  . Smokeless tobacco: Never Used  Substance Use Topics  . Alcohol use: Yes    Comment: occ  . Drug use: Yes    Types: Marijuana     Allergies   Patient has no known allergies.   Review of Systems Review of Systems  Constitutional: Negative for chills and fever.  HENT: Negative for ear pain and sore throat.   Eyes: Negative for pain and visual disturbance.  Respiratory: Negative for cough and shortness of breath.   Cardiovascular: Negative for chest pain and palpitations.  Gastrointestinal: Positive for diarrhea, nausea and vomiting. Negative for abdominal pain.  Genitourinary: Negative for dysuria and hematuria.  Musculoskeletal: Negative for arthralgias and back pain.  Skin: Negative for color change and rash.  Neurological: Negative for seizures and syncope.  All other systems reviewed and are negative.    Physical Exam Updated Vital Signs BP 134/65 (BP Location: Left Arm)   Pulse 71   Temp 98.3 F (36.8 C) (Oral)   Resp 16   Ht 6\' 3"  (1.905 m)   Wt 106 kg   SpO2 100%   BMI 29.21 kg/m   Physical Exam Vitals signs and nursing note reviewed.  Constitutional:      Appearance: He is well-developed.  HENT:     Head: Normocephalic and atraumatic.     Mouth/Throat:     Mouth: Mucous membranes are moist.  Pharynx: No oropharyngeal exudate.  Eyes:     Conjunctiva/sclera: Conjunctivae normal.  Neck:     Musculoskeletal: Neck supple.  Cardiovascular:     Rate and Rhythm: Normal rate and regular rhythm.     Pulses: Normal pulses.     Heart sounds: Normal heart sounds. No murmur.  Pulmonary:     Effort: Pulmonary effort is normal. No respiratory distress.     Breath sounds: Normal breath sounds.  Abdominal:     General: There is no distension.     Palpations: Abdomen is soft.     Tenderness: There is no abdominal tenderness.  Skin:    General: Skin is warm and dry.  Neurological:     Mental Status: He is alert.      ED Treatments /  Results  Labs (all labs ordered are listed, but only abnormal results are displayed) Labs Reviewed - No data to display  EKG None  Radiology No results found.  Procedures Procedures (including critical care time)  Medications Ordered in ED Medications  ondansetron (ZOFRAN-ODT) disintegrating tablet 4 mg (4 mg Oral Given 09/02/18 1140)     Initial Impression / Assessment and Plan / ED Course  I have reviewed the triage vital signs and the nursing notes.  Pertinent labs & imaging results that were available during my care of the patient were reviewed by me and considered in my medical decision making (see chart for details).     Roy Morris is a 27 year old male with no significant medical history presents the ED with nausea, vomiting, diarrhea.  Patient with normal vitals.  No fever.  Symptoms have been ongoing for the last 6 to 7 days.  Symptoms have gradually resolved.  He was sent here by work as he was vomiting there.  Patient denies any recent antibiotics.  States that his child has similar symptoms.  Likely viral gastroenteritis.  No abdominal tenderness on exam.  No concern for appendicitis.  Patient given prescription for Zofran.  No signs of dehydration on exam.  Patient denies any urinary symptoms.  Given follow-up recommendations and told to return to the ED if symptoms worsen.  Discharged in good condition. This chart was dictated using voice recognition software.    Despite best efforts to proofread,  errors can occur which can change the documentation meaning.   Final Clinical Impressions(s) / ED Diagnoses   Final diagnoses:  Nausea vomiting and diarrhea    ED Discharge Orders         Ordered    ondansetron (ZOFRAN) 4 MG tablet  Every 8 hours PRN     09/02/18 1202           Virgina NorfolkCuratolo, Sharlene Mccluskey, DO 09/02/18 1205

## 2018-09-02 NOTE — ED Triage Notes (Signed)
Diarrhea and vomiting x 1 week.

## 2018-09-29 ENCOUNTER — Encounter (HOSPITAL_BASED_OUTPATIENT_CLINIC_OR_DEPARTMENT_OTHER): Payer: Self-pay

## 2018-09-29 ENCOUNTER — Emergency Department (HOSPITAL_BASED_OUTPATIENT_CLINIC_OR_DEPARTMENT_OTHER): Payer: 59

## 2018-09-29 ENCOUNTER — Emergency Department (HOSPITAL_BASED_OUTPATIENT_CLINIC_OR_DEPARTMENT_OTHER)
Admission: EM | Admit: 2018-09-29 | Discharge: 2018-09-30 | Disposition: A | Discharge status: Home / Self Care | Payer: 59 | Attending: Emergency Medicine | Admitting: Emergency Medicine

## 2018-09-29 ENCOUNTER — Other Ambulatory Visit: Payer: Self-pay

## 2018-09-29 DIAGNOSIS — F1210 Cannabis abuse, uncomplicated: Secondary | ICD-10-CM | POA: Insufficient documentation

## 2018-09-29 DIAGNOSIS — M25531 Pain in right wrist: Secondary | ICD-10-CM | POA: Insufficient documentation

## 2018-09-29 DIAGNOSIS — Y9389 Activity, other specified: Secondary | ICD-10-CM | POA: Diagnosis not present

## 2018-09-29 DIAGNOSIS — Y998 Other external cause status: Secondary | ICD-10-CM | POA: Insufficient documentation

## 2018-09-29 DIAGNOSIS — F17290 Nicotine dependence, other tobacco product, uncomplicated: Secondary | ICD-10-CM | POA: Diagnosis not present

## 2018-09-29 DIAGNOSIS — Y92410 Unspecified street and highway as the place of occurrence of the external cause: Secondary | ICD-10-CM | POA: Insufficient documentation

## 2018-09-29 MED ORDER — IBUPROFEN 800 MG PO TABS
800.0000 mg | ORAL_TABLET | Freq: Once | ORAL | Status: AC
  Administered 2018-09-29: 800 mg via ORAL
  Filled 2018-09-29: qty 1

## 2018-09-29 MED ORDER — ACETAMINOPHEN 500 MG PO TABS
1000.0000 mg | ORAL_TABLET | Freq: Once | ORAL | Status: AC
  Administered 2018-09-29: 1000 mg via ORAL
  Filled 2018-09-29: qty 2
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
## 2018-09-30 MED ORDER — IBUPROFEN 800 MG PO TABS: 800.0000 mg | ORAL_TABLET | Freq: Three times a day (TID) | ORAL | 0 refills | Status: DC
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
## 2018-11-29 ENCOUNTER — Emergency Department (HOSPITAL_BASED_OUTPATIENT_CLINIC_OR_DEPARTMENT_OTHER)
Admission: EM | Admit: 2018-11-29 | Discharge: 2018-11-29 | Disposition: A | Discharge status: Home / Self Care | Payer: 59 | Attending: Emergency Medicine | Admitting: Emergency Medicine

## 2018-11-29 ENCOUNTER — Other Ambulatory Visit: Payer: Self-pay

## 2018-11-29 ENCOUNTER — Encounter (HOSPITAL_BASED_OUTPATIENT_CLINIC_OR_DEPARTMENT_OTHER): Payer: Self-pay | Admitting: Emergency Medicine

## 2018-11-29 DIAGNOSIS — R197 Diarrhea, unspecified: Secondary | ICD-10-CM | POA: Insufficient documentation

## 2018-11-29 DIAGNOSIS — Z79899 Other long term (current) drug therapy: Secondary | ICD-10-CM | POA: Diagnosis not present

## 2018-11-29 DIAGNOSIS — F17290 Nicotine dependence, other tobacco product, uncomplicated: Secondary | ICD-10-CM | POA: Insufficient documentation

## 2018-11-29 DIAGNOSIS — R112 Nausea with vomiting, unspecified: Secondary | ICD-10-CM

## 2018-11-29 MED ORDER — ONDANSETRON 4 MG PO TBDP: 4.0000 mg | ORAL_TABLET | Freq: Three times a day (TID) | ORAL | 0 refills | Status: DC | PRN

## 2018-11-29 NOTE — ED Triage Notes (Signed)
Reports diarrhea and vomiting which began last night.  Denies fevers, abdominal pain, dysuria.

## 2018-11-29 NOTE — ED Provider Notes (Signed)
[facility] EMERGENCY DEPARTMENT Provider Note   CSN: [id number] Arrival date & time: 11/29/18  1407    History   Chief Complaint Chief Complaint  Patient presents with  . Diarrhea    HPI Roy Morris is a 28 y.o. male.     28 year old male who presents with vomiting and diarrhea.  Last night the patient began having vomiting associated with nonbloody diarrhea.  Last episode of vomiting was around 10:00 this morning.  He has been able to drink ginger ale since then with no ongoing episodes of vomiting.  He reports abdominal pain only while he was vomiting and it completely resolved.  No pain currently.  No associated fevers, urinary symptoms, cough/cold symptoms, or recent travel.  His son was sick last week with vomiting and diarrhea.  No medications prior to arrival.  The history is provided by the patient.  Diarrhea    History reviewed. No pertinent past medical history.  There are no active problems to display for this patient.   History reviewed. No pertinent surgical history.      Home Medications    Prior to Admission medications   Medication Sig Start Date End Date Taking? Authorizing Provider  azithromycin (ZITHROMAX) 250 MG tablet Take 1 tablet (250 mg total) by mouth daily. Take first 2 tablets together, then 1 every day until finished. 05/26/18   Melene Plan, DO  benzonatate (TESSALON) 100 MG capsule Take 1 capsule (100 mg total) by mouth every 8 (eight) hours. 05/26/18   Melene Plan, DO  ibuprofen (ADVIL,MOTRIN) 800 MG tablet Take 1 tablet (800 mg total) by mouth 3 (three) times daily. 09/30/18   Palumbo, April, MD  meloxicam (MOBIC) 15 MG tablet Take 1 tablet (15 mg total) by mouth daily. 01/30/17   Palumbo, April, MD  ondansetron (ZOFRAN ODT) 4 MG disintegrating tablet Take 1 tablet (4 mg total) by mouth every 8 (eight) hours as needed for nausea or vomiting. 11/29/18   Little, Ambrose Finland, MD  predniSONE (DELTASONE) 10 MG tablet Take 4 tablets (40 mg  total) by mouth daily. 05/23/18   Vanetta Mulders, MD    Family History History reviewed. No pertinent family history.  Social History Social History   Tobacco Use  . Smoking status: Current Every Day Smoker    Types: Cigars  . Smokeless tobacco: Never Used  Substance Use Topics  . Alcohol use: Yes    Comment: occ  . Drug use: Yes    Types: Marijuana     Allergies   Patient has no known allergies.   Review of Systems Review of Systems  Gastrointestinal: Positive for diarrhea.   All other systems reviewed and are negative except that which was mentioned in HPI   Physical Exam Updated Vital Signs BP 119/68 (BP Location: Left Arm)   Pulse 66   Temp 98.9 F (37.2 C) (Oral)   Resp 16   Ht 6\' 3"  (1.905 m)   Wt 108.9 kg   SpO2 100%   BMI 30.00 kg/m   Physical Exam Vitals signs and nursing note reviewed.  Constitutional:      General: He is not in acute distress.    Appearance: He is well-developed.  HENT:     Head: Normocephalic and atraumatic.     Nose: Nose normal.     Mouth/Throat:     Mouth: Mucous membranes are moist.     Pharynx: Oropharynx is clear.  Eyes:     Conjunctiva/sclera: Conjunctivae normal.  Neck:  Musculoskeletal: Neck supple.  Cardiovascular:     Rate and Rhythm: Normal rate and regular rhythm.     Heart sounds: Normal heart sounds. No murmur.  Pulmonary:     Effort: Pulmonary effort is normal.     Breath sounds: Normal breath sounds.  Abdominal:     General: Bowel sounds are normal. There is no distension.     Palpations: Abdomen is soft.     Tenderness: There is no abdominal tenderness.  Skin:    General: Skin is warm and dry.  Neurological:     Mental Status: He is alert and oriented to person, place, and time.     Comments: Fluent speech  Psychiatric:        Judgment: Judgment normal.      ED Treatments / Results  Labs (all labs ordered are listed, but only abnormal results are displayed) Labs Reviewed - No data to  display  EKG None  Radiology No results found.  Procedures Procedures (including critical care time)  Medications Ordered in ED Medications - No data to display   Initial Impression / Assessment and Plan / ED Course  I have reviewed the triage vital signs and the nursing notes.  Pertinent labs & imaging results that were available during my care of the patient were reviewed by me and considered in my medical decision making (see chart for details).     Well appearing, normal VS, no complaints, abd soft and non-tender. Given vomiting has resolved and he has been tolerating PO, I do not feel he needs labwork or imaging at this time. Provided w/ zofran to use as needed for nausea and emphasized the importance of good hydration and gradual advancement of diet as tolerated.  Extensively reviewed return precautions including abdominal pain, intractable vomiting, bloody stools, or high fever.  He voiced understanding.  Final Clinical Impressions(s) / ED Diagnoses   Final diagnoses:  Nausea vomiting and diarrhea    ED Discharge Orders         Ordered    ondansetron (ZOFRAN ODT) 4 MG disintegrating tablet  Every 8 hours PRN     11/29/18 1437           Little, Ambrose Finland, MD 11/29/18 1444

## 2019-04-04 ENCOUNTER — Emergency Department (HOSPITAL_BASED_OUTPATIENT_CLINIC_OR_DEPARTMENT_OTHER)
Admission: EM | Admit: 2019-04-04 | Discharge: 2019-04-04 | Disposition: A | Discharge status: Home / Self Care | Payer: 59 | Attending: Emergency Medicine | Admitting: Emergency Medicine

## 2019-04-04 ENCOUNTER — Other Ambulatory Visit: Payer: Self-pay

## 2019-04-04 ENCOUNTER — Encounter (HOSPITAL_BASED_OUTPATIENT_CLINIC_OR_DEPARTMENT_OTHER): Payer: Self-pay

## 2019-04-04 DIAGNOSIS — F17290 Nicotine dependence, other tobacco product, uncomplicated: Secondary | ICD-10-CM | POA: Diagnosis not present

## 2019-04-04 DIAGNOSIS — R112 Nausea with vomiting, unspecified: Secondary | ICD-10-CM | POA: Diagnosis present

## 2019-04-04 DIAGNOSIS — Z791 Long term (current) use of non-steroidal anti-inflammatories (NSAID): Secondary | ICD-10-CM | POA: Diagnosis not present

## 2019-04-04 DIAGNOSIS — R1111 Vomiting without nausea: Secondary | ICD-10-CM

## 2019-04-04 MED ORDER — ONDANSETRON HCL 4 MG PO TABS: 4.0000 mg | ORAL_TABLET | Freq: Three times a day (TID) | ORAL | 0 refills | Status: DC | PRN

## 2019-04-04 NOTE — ED Triage Notes (Signed)
Pt states was coughing earlier and vomited x1.  Denies fever.  Does report initial scratchy throat which brought on coughing episode then vomiting.  Denies abdominal pain. Denies change in urination, does report diarrhea intermittent for 4 days.

## 2019-04-04 NOTE — ED Provider Notes (Signed)
[facility] EMERGENCY DEPARTMENT Provider Note   CSN: [id number] Arrival date & time: 04/04/19  1040     History   Chief Complaint Chief Complaint  Patient presents with  . Emesis    HPI Lucillie GarfinkelRyan Cisney is a 28 y.o. male.     The history is provided by the patient.  Emesis Severity:  Mild Quality:  Undigested food Able to tolerate:  Liquids Progression:  Resolved Chronicity:  New Context: post-tussive   Relieved by:  Nothing Ineffective treatments:  None tried Associated symptoms: cough   Associated symptoms: no abdominal pain, no arthralgias, no chills, no diarrhea, no fever, no headaches, no myalgias, no sore throat and no URI   Risk factors: no suspect food intake     History reviewed. No pertinent past medical history.  There are no active problems to display for this patient.   History reviewed. No pertinent surgical history.      Home Medications    Prior to Admission medications   Medication Sig Start Date End Date Taking? Authorizing Provider  azithromycin (ZITHROMAX) 250 MG tablet Take 1 tablet (250 mg total) by mouth daily. Take first 2 tablets together, then 1 every day until finished. 05/26/18   Melene PlanFloyd, Dan, DO  benzonatate (TESSALON) 100 MG capsule Take 1 capsule (100 mg total) by mouth every 8 (eight) hours. 05/26/18   Melene PlanFloyd, Dan, DO  ibuprofen (ADVIL,MOTRIN) 800 MG tablet Take 1 tablet (800 mg total) by mouth 3 (three) times daily. 09/30/18   Palumbo, April, MD  meloxicam (MOBIC) 15 MG tablet Take 1 tablet (15 mg total) by mouth daily. 01/30/17   Palumbo, April, MD  ondansetron (ZOFRAN ODT) 4 MG disintegrating tablet Take 1 tablet (4 mg total) by mouth every 8 (eight) hours as needed for nausea or vomiting. 11/29/18   Little, Ambrose Finlandachel Morgan, MD  ondansetron (ZOFRAN) 4 MG tablet Take 1 tablet (4 mg total) by mouth every 8 (eight) hours as needed for up to 12 doses for nausea or vomiting. 04/04/19   Ryeleigh Santore, DO  predniSONE (DELTASONE) 10 MG  tablet Take 4 tablets (40 mg total) by mouth daily. 05/23/18   Vanetta MuldersZackowski, Scott, MD    Family History History reviewed. No pertinent family history.  Social History Social History   Tobacco Use  . Smoking status: Current Every Day Smoker    Types: Cigars  . Smokeless tobacco: Never Used  Substance Use Topics  . Alcohol use: Yes    Comment: occ  . Drug use: Yes    Types: Marijuana     Allergies   Patient has no known allergies.   Review of Systems Review of Systems  Constitutional: Negative for chills and fever.  HENT: Negative for sore throat.   Respiratory: Positive for cough.   Gastrointestinal: Positive for vomiting. Negative for abdominal pain and diarrhea.  Musculoskeletal: Negative for arthralgias and myalgias.  Neurological: Negative for headaches.     Physical Exam Updated Vital Signs BP (!) 133/58 (BP Location: Right Arm)   Pulse 62   Temp 98.2 F (36.8 C) (Oral)   Resp 16   Ht 6\' 3"  (1.905 m)   Wt 111.1 kg   SpO2 100%   BMI 30.62 kg/m   Physical Exam Vitals signs and nursing note reviewed.  Constitutional:      Appearance: He is well-developed.  HENT:     Head: Normocephalic and atraumatic.  Eyes:     Conjunctiva/sclera: Conjunctivae normal.     Pupils: Pupils are equal,  round, and reactive to light.  Neck:     Musculoskeletal: Neck supple.  Cardiovascular:     Rate and Rhythm: Normal rate and regular rhythm.     Pulses: Normal pulses.     Heart sounds: Normal heart sounds. No murmur.  Pulmonary:     Effort: Pulmonary effort is normal. No respiratory distress.     Breath sounds: Normal breath sounds.  Abdominal:     General: Abdomen is flat. There is no distension.     Palpations: Abdomen is soft. There is no mass.     Tenderness: There is no abdominal tenderness.     Hernia: No hernia is present.  Skin:    General: Skin is warm and dry.  Neurological:     Mental Status: He is alert.  Psychiatric:        Mood and Affect: Mood  normal.      ED Treatments / Results  Labs (all labs ordered are listed, but only abnormal results are displayed) Labs Reviewed - No data to display  EKG None  Radiology No results found.  Procedures Procedures (including critical care time)  Medications Ordered in ED Medications - No data to display   Initial Impression / Assessment and Plan / ED Course  I have reviewed the triage vital signs and the nursing notes.  Pertinent labs & imaging results that were available during my care of the patient were reviewed by me and considered in my medical decision making (see chart for details).        Larrell Rapozo is a 28 year old male who presents to the ED with episode of emesis.  Patient normal vitals.  No fever.  Patient states that he was coughing had a scratchy throat threw up once.  Has no more symptoms.  Was sent from work.  Has no fever.  Has not had any infectious symptoms otherwise.  Has no abdominal tenderness.  No abdominal pain.  No signs of throat infection on exam.  Clear breath sounds.  Overall likely episode of post tussive emesis.  Given prescription for Zofran to use as needed.  No concern for coronavirus or other acute process at this time.  History and physical not consistent with appendicitis.  Given return precautions and discharged from ED in good condition.  This chart was dictated using voice recognition software.  Despite best efforts to proofread,  errors can occur which can change the documentation meaning.    Spenser Harren was evaluated in Emergency Department on 04/04/2019 for the symptoms described in the history of present illness. He was evaluated in the context of the global COVID-19 pandemic, which necessitated consideration that the patient might be at risk for infection with the SARS-CoV-2 virus that causes COVID-19. Institutional protocols and algorithms that pertain to the evaluation of patients at risk for COVID-19 are in a state of rapid change based  on information released by regulatory bodies including the CDC and federal and state organizations. These policies and algorithms were followed during the patient's care in the ED.   Final Clinical Impressions(s) / ED Diagnoses   Final diagnoses:  Vomiting without nausea, intractability of vomiting not specified, unspecified vomiting type    ED Discharge Orders         Ordered    ondansetron (ZOFRAN) 4 MG tablet  Every 8 hours PRN     04/04/19 1059           Talulah Schirmer, DO 04/04/19 1102

## 2019-06-14 ENCOUNTER — Emergency Department (HOSPITAL_BASED_OUTPATIENT_CLINIC_OR_DEPARTMENT_OTHER)
Admission: EM | Admit: 2019-06-14 | Discharge: 2019-06-14 | Disposition: A | Discharge status: Home / Self Care | Payer: 59 | Attending: Emergency Medicine | Admitting: Emergency Medicine

## 2019-06-14 ENCOUNTER — Other Ambulatory Visit: Payer: Self-pay

## 2019-06-14 ENCOUNTER — Emergency Department (HOSPITAL_BASED_OUTPATIENT_CLINIC_OR_DEPARTMENT_OTHER): Payer: 59

## 2019-06-14 ENCOUNTER — Encounter (HOSPITAL_BASED_OUTPATIENT_CLINIC_OR_DEPARTMENT_OTHER): Payer: Self-pay | Admitting: Emergency Medicine

## 2019-06-14 DIAGNOSIS — F17210 Nicotine dependence, cigarettes, uncomplicated: Secondary | ICD-10-CM | POA: Insufficient documentation

## 2019-06-14 DIAGNOSIS — R05 Cough: Secondary | ICD-10-CM | POA: Diagnosis present

## 2019-06-14 DIAGNOSIS — J4521 Mild intermittent asthma with (acute) exacerbation: Secondary | ICD-10-CM

## 2019-06-14 DIAGNOSIS — J069 Acute upper respiratory infection, unspecified: Secondary | ICD-10-CM | POA: Diagnosis not present

## 2019-06-14 DIAGNOSIS — Z20828 Contact with and (suspected) exposure to other viral communicable diseases: Secondary | ICD-10-CM | POA: Diagnosis not present

## 2019-06-14 MED ORDER — AEROCHAMBER PLUS FLO-VU MISC
1.0000 | Freq: Once | Status: AC
  Administered 2019-06-14: 1
  Filled 2019-06-14: qty 1

## 2019-06-14 MED ORDER — BENZONATATE 200 MG PO CAPS: 200.0000 mg | ORAL_CAPSULE | Freq: Three times a day (TID) | ORAL | 0 refills | Status: AC

## 2019-06-14 MED ORDER — PREDNISONE 10 MG PO TABS: 40.0000 mg | ORAL_TABLET | Freq: Every day | ORAL | 0 refills | Status: AC

## 2019-06-14 MED ORDER — ALBUTEROL SULFATE HFA 108 (90 BASE) MCG/ACT IN AERS
2.0000 | INHALATION_SPRAY | Freq: Once | RESPIRATORY_TRACT | Status: AC
  Administered 2019-06-14: 2 via RESPIRATORY_TRACT
  Filled 2019-06-14: qty 6.7

## 2019-06-14 MED FILL — predniSONE 10 MG TABS: 10 | 5 days supply | Qty: 20 | Fill #0

## 2019-06-14 MED FILL — BENZONATATE 200 MG CAP: 200 | 10 days supply | Qty: 30 | Fill #0

## 2019-06-14 NOTE — Discharge Instructions (Signed)
Use inhaler and Tessalon as needed as prescribed for cough and wheezing. Take prednisone daily as prescribed and complete the full course. Symptoms possibly related to COVID-19 infection, your test has been sent out today.  You should quarantine until you know your test results.  If your test results are positive or you develop fever you should quarantine for a total of 10 days since symptom onset and at least 72 hours fever free.

## 2019-06-14 NOTE — ED Triage Notes (Signed)
Sore throat and cough since Friday.

## 2019-06-14 NOTE — ED Provider Notes (Signed)
[facility] EMERGENCY DEPARTMENT Provider Note   CSN: [id number] Arrival date & time: 06/14/19  0957     History   Chief Complaint Chief Complaint  Patient presents with  . Cough  . Sore Throat    HPI Roy Morris is a 28 y.o. male.     28 year old male presents with complaint of sore throat and nonproductive cough.  Patient states symptoms started 3 days ago after exposure to a coworker with similar symptoms.  Patient reports he is had loose stools at times as well.  Denies fevers, chills, loss of sense of smell or taste.  Patient has a history of asthma, does not regularly use an inhaler, denies feeling short of breath at this time.  No other complaints or concerns today.  Roy Morris was evaluated in Emergency Department on 06/14/2019 for the symptoms described in the history of present illness. He was evaluated in the context of the global COVID-19 pandemic, which necessitated consideration that the patient might be at risk for infection with the SARS-CoV-2 virus that causes COVID-19. Institutional protocols and algorithms that pertain to the evaluation of patients at risk for COVID-19 are in a state of rapid change based on information released by regulatory bodies including the CDC and federal and state organizations. These policies and algorithms were followed during the patient's care in the ED.      History reviewed. No pertinent past medical history.  There are no active problems to display for this patient.   History reviewed. No pertinent surgical history.      Home Medications    Prior to Admission medications   Medication Sig Start Date End Date Taking? Authorizing Provider  benzonatate (TESSALON) 200 MG capsule Take 1 capsule (200 mg total) by mouth every 8 (eight) hours for 10 days. 06/14/19 06/24/19  Tacy Learn, PA-C  ibuprofen (ADVIL,MOTRIN) 800 MG tablet Take 1 tablet (800 mg total) by mouth 3 (three) times daily. 09/30/18   Palumbo, April, MD   predniSONE (DELTASONE) 10 MG tablet Take 4 tablets (40 mg total) by mouth daily for 5 days. 06/14/19 06/19/19  Tacy Learn, PA-C    Family History No family history on file.  Social History Social History   Tobacco Use  . Smoking status: Current Every Day Smoker    Types: Cigars  . Smokeless tobacco: Never Used  Substance Use Topics  . Alcohol use: Yes    Comment: occ  . Drug use: Yes    Types: Marijuana     Allergies   Patient has no known allergies.   Review of Systems Review of Systems  Constitutional: Negative for chills and fever.  HENT: Positive for sore throat. Negative for congestion.   Respiratory: Positive for cough and wheezing. Negative for shortness of breath.   Cardiovascular: Negative for chest pain.  Gastrointestinal: Positive for diarrhea. Negative for constipation, nausea and vomiting.  Skin: Negative for rash and wound.  Allergic/Immunologic: Negative for immunocompromised state.  Neurological: Negative for headaches.  Hematological: Negative for adenopathy.  Psychiatric/Behavioral: Negative for confusion.  All other systems reviewed and are negative.    Physical Exam Updated Vital Signs BP (!) 148/63 (BP Location: Left Arm)   Pulse 62   Temp 98.2 F (36.8 C) (Oral)   Resp 18   Ht 6\' 3"  (1.905 m)   SpO2 100%   BMI 30.62 kg/m   Physical Exam Vitals signs and nursing note reviewed.  Constitutional:      General: He is not  in acute distress.    Appearance: He is well-developed. He is not diaphoretic.  HENT:     Head: Normocephalic and atraumatic.     Right Ear: Tympanic membrane and ear canal normal.     Left Ear: Tympanic membrane and ear canal normal.     Nose: Congestion present.     Mouth/Throat:     Mouth: Mucous membranes are moist.     Pharynx: Uvula midline. No pharyngeal swelling, oropharyngeal exudate, posterior oropharyngeal erythema or uvula swelling.     Tonsils: No tonsillar exudate. 1+ on the right. 1+ on the left.   Eyes:     Conjunctiva/sclera: Conjunctivae normal.  Cardiovascular:     Rate and Rhythm: Normal rate and regular rhythm.     Heart sounds: Normal heart sounds.  Pulmonary:     Effort: Pulmonary effort is normal.     Breath sounds: Wheezing present. No decreased breath sounds.     Comments: Mild diffuse expiratory wheezing.  Skin:    General: Skin is warm and dry.     Findings: No rash.  Neurological:     Mental Status: He is alert and oriented to person, place, and time.  Psychiatric:        Behavior: Behavior normal.      ED Treatments / Results  Labs (all labs ordered are listed, but only abnormal results are displayed) Labs Reviewed  NOVEL CORONAVIRUS, NAA (HOSP ORDER, SEND-OUT TO REF LAB; TAT 18-24 HRS)    EKG None  Radiology Dg Chest Port 1 View  Result Date: 06/14/2019 CLINICAL DATA:  Sore throat and cough since Friday. EXAM: PORTABLE CHEST 1 VIEW COMPARISON:  Chest x-ray 05/26/2018 FINDINGS: The cardiac silhouette, mediastinal and hilar contours are normal. The lungs are clear. No pleural effusions. No pulmonary lesions. The bony thorax is intact. IMPRESSION: No acute cardiopulmonary findings. Electronically Signed   By: Rudie MeyerP.  Gallerani M.D.   On: 06/14/2019 12:03    Procedures Procedures (including critical care time)  Medications Ordered in ED Medications  albuterol (VENTOLIN HFA) 108 (90 Base) MCG/ACT inhaler 2 puff (2 puffs Inhalation Given 06/14/19 1150)  aerochamber plus with mask device 1 each (1 each Other Given 06/14/19 1155)     Initial Impression / Assessment and Plan / ED Course  I have reviewed the triage vital signs and the nursing notes.  Pertinent labs & imaging results that were available during my care of the patient were reviewed by me and considered in my medical decision making (see chart for details).  Clinical Course as of Jun 13 1218  Tue Jun 14, 2019  [id number] 28 year old male with history of asthma presents with complaint of cough with  wheezing, diarrhea and sore throat.  On exam patient is well-appearing, has mild diffuse expiratory wheezes.  Patient was given albuterol inhaler treatment while in the emergency room.  Chest x-ray is unremarkable.  Patient will be discharged with send out COVID testing, prescription for prednisone and Tessalon with inhaler to continue to use as prescribed.  Patient advised to quarantine awaiting his test results.  Return to ER for new or worsening symptoms.   [LM]    Clinical Course User Index [LM] Jeannie FendMurphy, Yoav Okane A, PA-C      Final Clinical Impressions(s) / ED Diagnoses   Final diagnoses:  Upper respiratory tract infection, unspecified type  Mild intermittent asthma with acute exacerbation    ED Discharge Orders         Ordered    benzonatate (TESSALON) 200  MG capsule  Every 8 hours     06/14/19 1217    predniSONE (DELTASONE) 10 MG tablet  Daily     06/14/19 1217           Jeannie Fend, PA-C 06/14/19 1219    Arby Barrette, MD 06/14/19 [phone]

## 2019-06-15 LAB — NOVEL CORONAVIRUS, NAA (HOSP ORDER, SEND-OUT TO REF LAB; TAT 18-24 HRS): SARS-CoV-2, NAA: NOT DETECTED

## 2019-09-25 IMAGING — CR DG CHEST 2V
2 series · 2 of 2 positions shown · non-contrast
Comparison: Chest x-ray dated 03/12/2014.

CLINICAL DATA: One month of cough, sinus drainage, postnasal drip
and facial pain. Productive cough.

EXAM:
CHEST - 2 VIEW

[w chest pa]
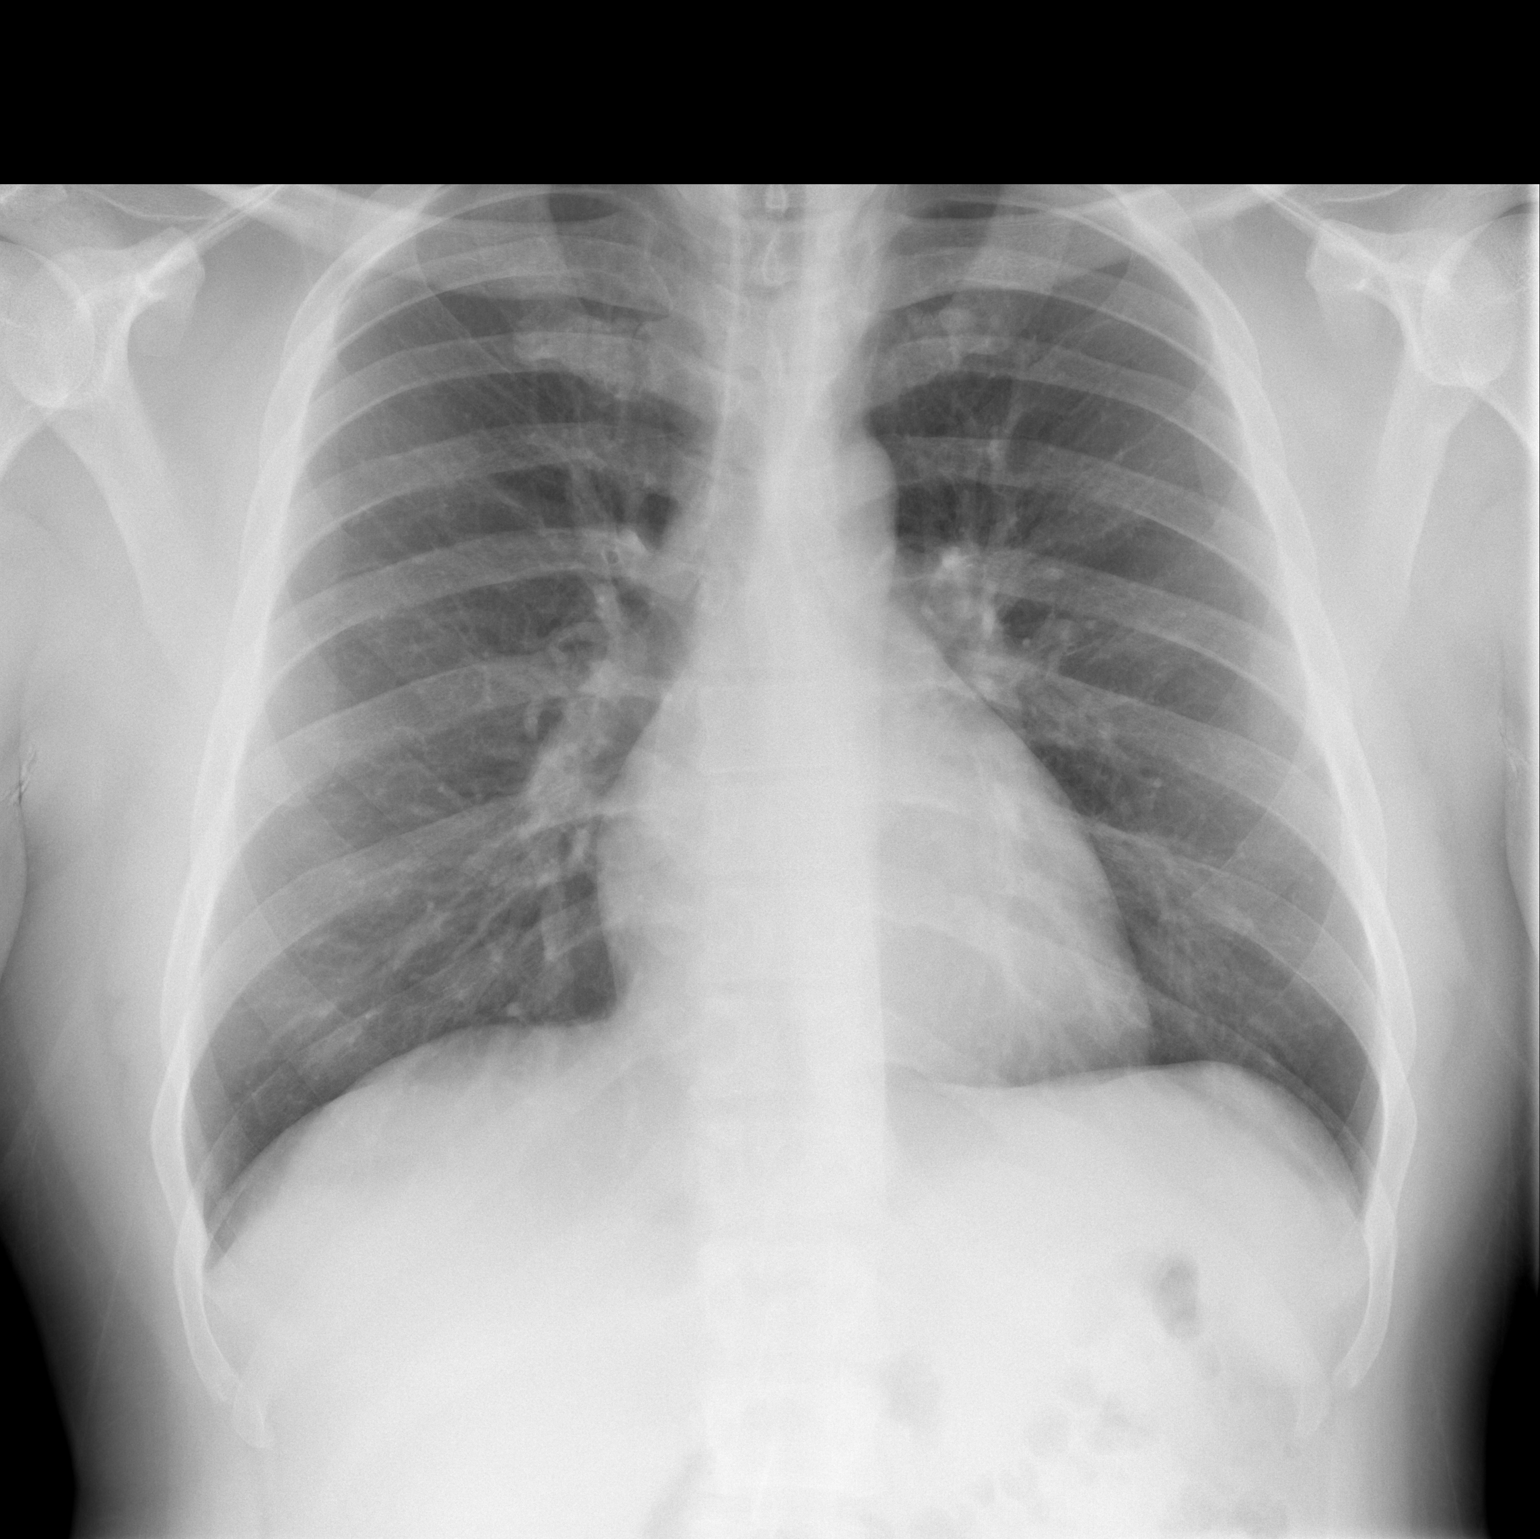

[w chest lat]
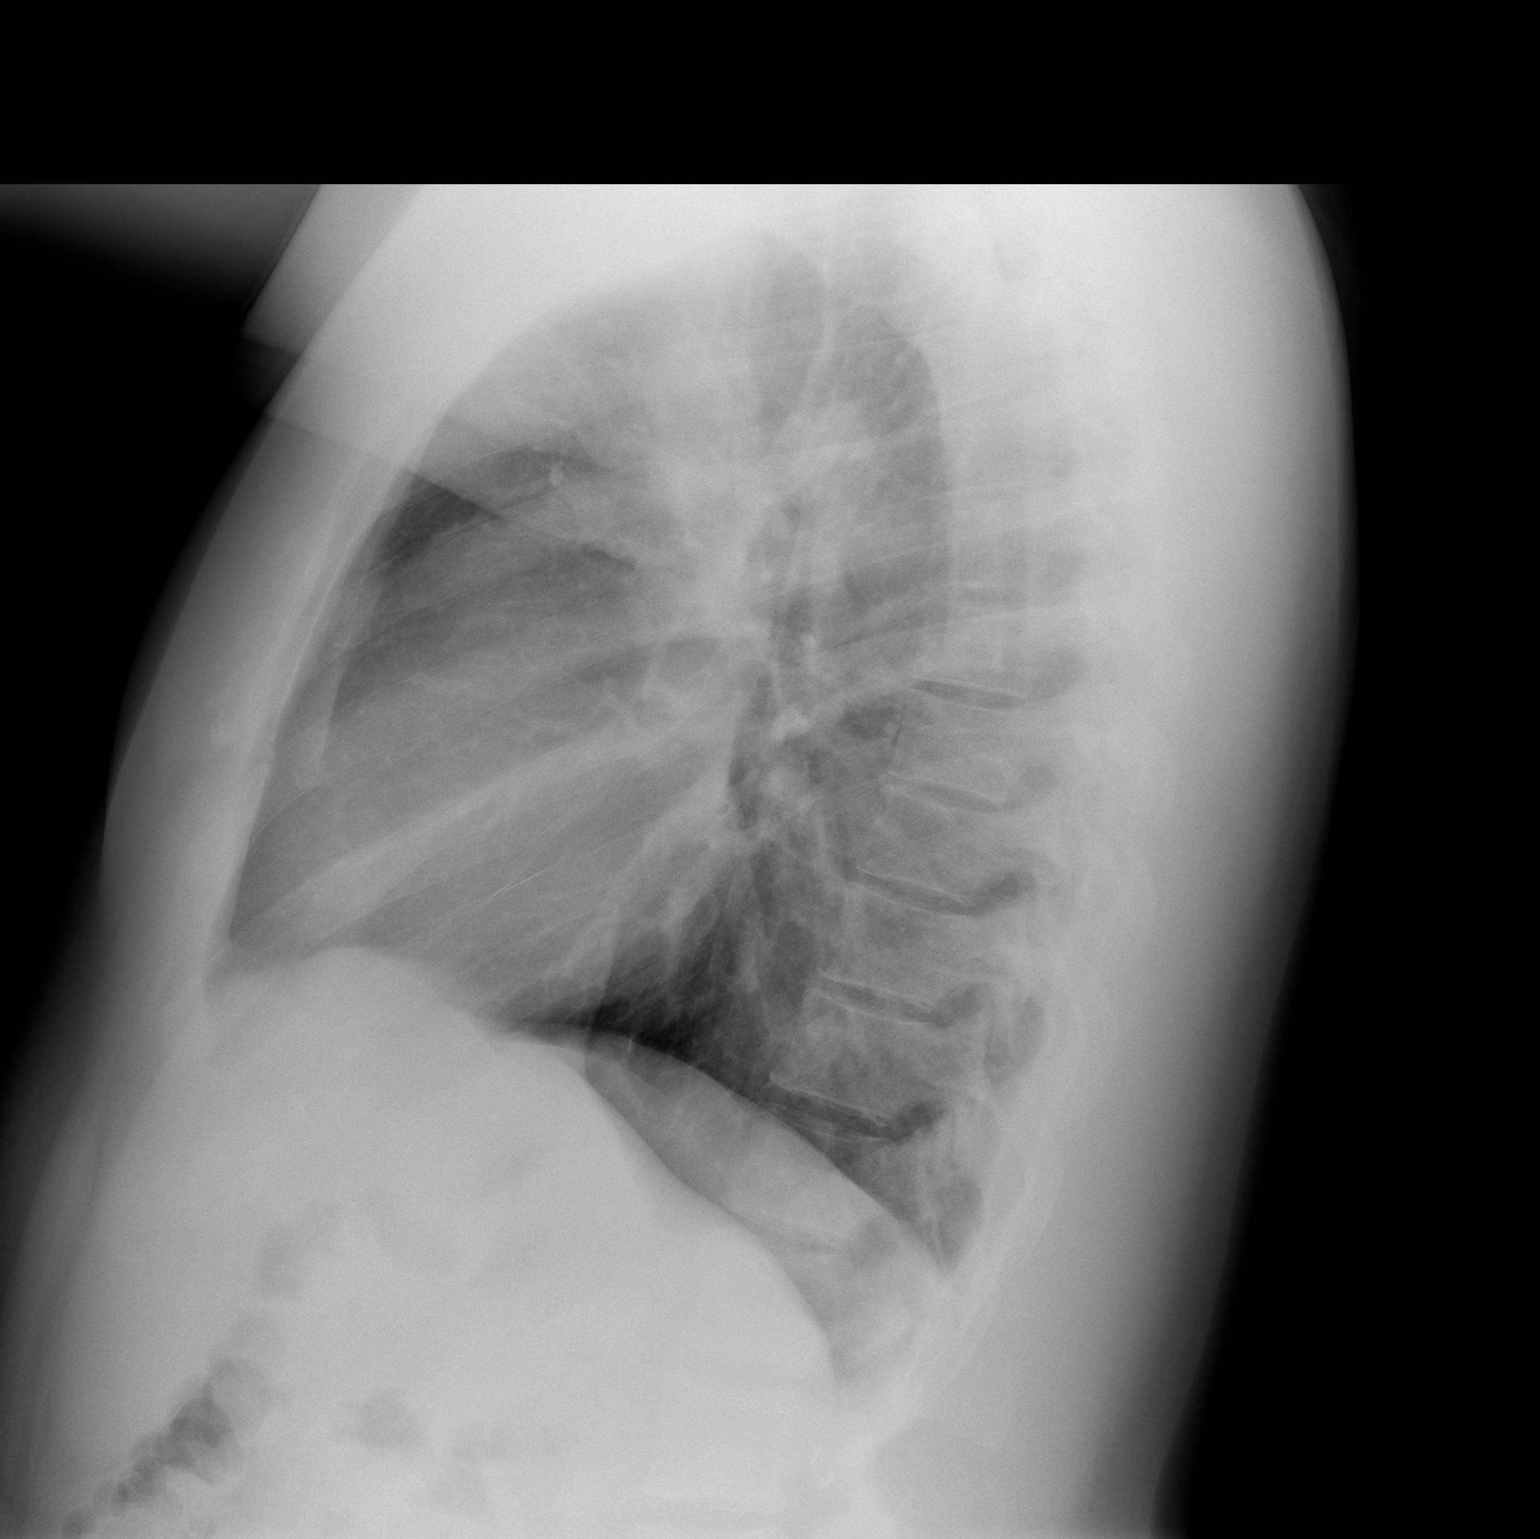

[2 of 2 positions shown; findings below may reference images not displayed]

FINDINGS: The heart size and mediastinal contours are within normal limits.
Both lungs are clear. The visualized skeletal structures are
unremarkable.
IMPRESSION: No active cardiopulmonary disease.  No evidence of pneumonia.

## 2019-12-13 ENCOUNTER — Encounter (HOSPITAL_BASED_OUTPATIENT_CLINIC_OR_DEPARTMENT_OTHER): Payer: Self-pay | Admitting: *Deleted

## 2019-12-13 ENCOUNTER — Emergency Department (HOSPITAL_BASED_OUTPATIENT_CLINIC_OR_DEPARTMENT_OTHER): Payer: 59

## 2019-12-13 ENCOUNTER — Other Ambulatory Visit: Payer: Self-pay

## 2019-12-13 ENCOUNTER — Emergency Department (HOSPITAL_BASED_OUTPATIENT_CLINIC_OR_DEPARTMENT_OTHER)
Admission: EM | Admit: 2019-12-13 | Discharge: 2019-12-13 | Disposition: A | Discharge status: Home / Self Care | Payer: 59 | Attending: Emergency Medicine | Admitting: Emergency Medicine

## 2019-12-13 DIAGNOSIS — R112 Nausea with vomiting, unspecified: Secondary | ICD-10-CM | POA: Insufficient documentation

## 2019-12-13 DIAGNOSIS — R1030 Lower abdominal pain, unspecified: Secondary | ICD-10-CM | POA: Diagnosis present

## 2019-12-13 DIAGNOSIS — F17290 Nicotine dependence, other tobacco product, uncomplicated: Secondary | ICD-10-CM | POA: Insufficient documentation

## 2019-12-13 DIAGNOSIS — R1084 Generalized abdominal pain: Secondary | ICD-10-CM | POA: Insufficient documentation

## 2019-12-13 DIAGNOSIS — Z79899 Other long term (current) drug therapy: Secondary | ICD-10-CM | POA: Diagnosis not present

## 2019-12-13 DIAGNOSIS — R197 Diarrhea, unspecified: Secondary | ICD-10-CM

## 2019-12-13 LAB — CBC
HCT: 53.7 % — ABNORMAL HIGH (ref 39.0–52.0)
Hemoglobin: 17.3 g/dL — ABNORMAL HIGH (ref 13.0–17.0)
MCH: 28.2 pg (ref 26.0–34.0)
MCHC: 32.2 g/dL (ref 30.0–36.0)
MCV: 87.5 fL (ref 80.0–100.0)
Platelets: 338 10*3/uL (ref 150–400)
RBC: 6.14 MIL/uL — ABNORMAL HIGH (ref 4.22–5.81)
RDW: 13.3 % (ref 11.5–15.5)
WBC: 7.5 10*3/uL (ref 4.0–10.5)
nRBC: 0 % (ref 0.0–0.2)

## 2019-12-13 LAB — COMPREHENSIVE METABOLIC PANEL
ALT: 18 U/L (ref 0–44)
AST: 17 U/L (ref 15–41)
Albumin: 4.5 g/dL (ref 3.5–5.0)
Alkaline Phosphatase: 50 U/L (ref 38–126)
Anion gap: 11 (ref 5–15)
BUN: 9 mg/dL (ref 6–20)
CO2: 25 mmol/L (ref 22–32)
Calcium: 9.3 mg/dL (ref 8.9–10.3)
Chloride: 100 mmol/L (ref 98–111)
Creatinine, Ser: 0.9 mg/dL (ref 0.61–1.24)
GFR calc Af Amer: >60 mL/min (ref >60)
GFR calc non Af Amer: >60 mL/min (ref >60)
Glucose, Bld: 107 mg/dL — ABNORMAL HIGH (ref 70–99)
Potassium: 4.1 mmol/L (ref 3.5–5.1)
Sodium: 136 mmol/L (ref 135–145)
Total Bilirubin: 0.6 mg/dL (ref 0.3–1.2)
Total Protein: 7.4 g/dL (ref 6.5–8.1)

## 2019-12-13 LAB — URINALYSIS, ROUTINE W REFLEX MICROSCOPIC
Bilirubin Urine: NEGATIVE
Glucose, UA: NEGATIVE mg/dL
Hgb urine dipstick: NEGATIVE
Ketones, ur: NEGATIVE mg/dL
Leukocytes,Ua: NEGATIVE
Nitrite: NEGATIVE
Protein, ur: NEGATIVE mg/dL
Specific Gravity, Urine: 1.015 (ref 1.005–1.030)
pH: 7 (ref 5.0–8.0)

## 2019-12-13 LAB — LIPASE, BLOOD: Lipase: 20 U/L (ref 11–51)

## 2019-12-13 MED ORDER — DICYCLOMINE HCL 10 MG PO CAPS
10.0000 mg | ORAL_CAPSULE | Freq: Once | ORAL | Status: AC
  Administered 2019-12-13: 10 mg via ORAL

## 2019-12-13 MED ORDER — DICYCLOMINE HCL 10 MG/5ML PO SOLN
10.0000 mg | Freq: Once | ORAL | Status: DC
  Filled 2019-12-13: qty 5

## 2019-12-13 MED ORDER — LOPERAMIDE HCL 2 MG PO CAPS: 2.0000 mg | ORAL_CAPSULE | Freq: Four times a day (QID) | ORAL | 0 refills | Status: DC | PRN

## 2019-12-13 MED ORDER — DICYCLOMINE HCL 20 MG PO TABS: 20.0000 mg | ORAL_TABLET | Freq: Two times a day (BID) | ORAL | 0 refills | Status: DC | PRN

## 2019-12-13 MED ORDER — ONDANSETRON HCL 4 MG/2ML IJ SOLN
4.0000 mg | Freq: Once | INTRAMUSCULAR | Status: AC
  Administered 2019-12-13: 15:00:00 4 mg via INTRAVENOUS
  Filled 2019-12-13: qty 2

## 2019-12-13 MED ORDER — SODIUM CHLORIDE 0.9% FLUSH
3.0000 mL | Freq: Once | INTRAVENOUS | Status: AC
  Filled 2019-12-13: qty 3

## 2019-12-13 MED ORDER — SODIUM CHLORIDE 0.9 % IV BOLUS
1000.0000 mL | Freq: Once | INTRAVENOUS | Status: AC
  Administered 2019-12-13: 15:00:00 1000 mL via INTRAVENOUS

## 2019-12-13 MED ORDER — ONDANSETRON 4 MG PO TBDP: 4.0000 mg | ORAL_TABLET | Freq: Three times a day (TID) | ORAL | 0 refills | Status: DC | PRN

## 2019-12-13 MED ORDER — DICYCLOMINE HCL 10 MG PO CAPS
ORAL_CAPSULE | ORAL | Status: AC
  Filled 2019-12-13: qty 1

## 2019-12-13 MED ORDER — IOHEXOL 300 MG/ML  SOLN
100.0000 mL | Freq: Once | INTRAMUSCULAR | Status: AC | PRN
  Administered 2019-12-13: 100 mL via INTRAVENOUS

## 2019-12-13 MED ORDER — PROBIOTIC 1-250 BILLION-MG PO CAPS: 1.0000 | ORAL_CAPSULE | Freq: Every day | ORAL | 0 refills | Status: DC

## 2019-12-13 NOTE — ED Triage Notes (Signed)
Abdominal pain. Diarrhea x 2 days with bright red blood in his stools. Vomited on Monday.

## 2019-12-13 NOTE — Discharge Instructions (Signed)
1. Medications: Take Bentyl as needed for crampy abdominal pain.  Take Zofran as needed for nausea.  Let this medicine dissolve under your tongue and wait around 10-20 minutes before eating or drinking after taking this medication.  You can take Imodium as needed for diarrhea but if the diarrhea is not very bothersome then I do not recommend taking it.  Start taking a probiotic or eating yogurt daily. 2. Treatment: rest, drink plenty of fluids, advance diet slowly.  Eat a diet of bland foods that will not upset your stomach.  Avoid spicy foods, fried foods, fatty foods, acidic foods, or alcohol. 3. Follow Up: Please followup with your primary doctor in 3 days for discussion of your diagnoses and further evaluation after today's visit; if you do not have a primary care doctor use the resource guide provided to find one; Please return to the ER for persistent vomiting, high fevers or worsening symptoms

## 2019-12-13 NOTE — ED Provider Notes (Signed)
[facility] EMERGENCY DEPARTMENT Provider Note   CSN: [id number] Arrival date & time: 12/13/19  1243     History Chief Complaint  Patient presents with  . Abdominal Pain  . Diarrhea    Roy Morris is a 29 y.o. male with no significant past medical history presenting for evaluation of acute onset, progressively worsening abdominal pain for 3 days.  He reports that symptoms began Sunday evening after eating crab legs for dinner.  He reports an intermittent sharp stabbing pain inferior to his umbilicus.  It worsens with lifting his 40 pound son and with certain movements such as leaning forward.  He has had a few episodes of nonbloody nonbilious emesis but none today.  He is also had a few episodes of watery initially nonbloody but then became bloody diarrhea.  He noted bright red blood with wiping.  He reports that he has had bloody bowel movements previously when he has strained to pass stool previously.  No prior history of abdominal surgery.  He denies any recent travel or suspicious food intake, no known sick contacts.  He denies fever, chest pain, shortness of breath, cough, or urinary symptoms.  He does endorse feeling very tired and wanting to sleep more.  He has tried Pepto-Bismol and Tylenol PM with some relief.  The history is provided by the patient.       History reviewed. No pertinent past medical history.  There are no problems to display for this patient.   History reviewed. No pertinent surgical history.     No family history on file.  Social History   Tobacco Use  . Smoking status: Current Every Day Smoker    Types: Cigars  . Smokeless tobacco: Never Used  Substance Use Topics  . Alcohol use: Yes    Comment: occ  . Drug use: Yes    Types: Marijuana    Home Medications Prior to Admission medications   Medication Sig Start Date End Date Taking? Authorizing Provider  Bacillus Coagulans-Inulin (PROBIOTIC) 1-250 BILLION-MG CAPS Take 1 capsule by  mouth daily. 12/13/19   Kruze Atchley A, PA-C  dicyclomine (BENTYL) 20 MG tablet Take 1 tablet (20 mg total) by mouth 2 (two) times daily as needed for spasms. 12/13/19   Laini Urick A, PA-C  ibuprofen (ADVIL,MOTRIN) 800 MG tablet Take 1 tablet (800 mg total) by mouth 3 (three) times daily. 09/30/18   Palumbo, April, MD  loperamide (IMODIUM) 2 MG capsule Take 1 capsule (2 mg total) by mouth 4 (four) times daily as needed for diarrhea or loose stools. 12/13/19   Sarh Kirschenbaum A, PA-C  ondansetron (ZOFRAN ODT) 4 MG disintegrating tablet Take 1 tablet (4 mg total) by mouth every 8 (eight) hours as needed for nausea or vomiting. 12/13/19   Michela Pitcher A, PA-C    Allergies    Patient has no known allergies.  Review of Systems   Review of Systems  Constitutional: Negative for chills and fever.  Respiratory: Negative for shortness of breath.   Cardiovascular: Negative for chest pain.  Gastrointestinal: Positive for abdominal pain, blood in stool, diarrhea, nausea and vomiting.  Genitourinary: Negative for dysuria, frequency, hematuria and urgency.  All other systems reviewed and are negative.   Physical Exam Updated Vital Signs BP 131/70   Pulse 84   Temp 98.4 F (36.9 C) (Oral)   Resp 20   Ht 6\' 3"  (1.905 m)   Wt 111.1 kg   SpO2 100%   BMI 30.61 kg/m  Physical Exam Vitals and nursing note reviewed.  Constitutional:      General: He is not in acute distress.    Appearance: He is well-developed.  HENT:     Head: Normocephalic and atraumatic.  Eyes:     General:        Right eye: No discharge.        Left eye: No discharge.     Conjunctiva/sclera: Conjunctivae normal.  Neck:     Vascular: No JVD.     Trachea: No tracheal deviation.  Cardiovascular:     Rate and Rhythm: Normal rate and regular rhythm.  Pulmonary:     Effort: Pulmonary effort is normal.     Breath sounds: Normal breath sounds.  Abdominal:     General: Abdomen is protuberant. Bowel sounds are decreased. There is no  distension.     Palpations: Abdomen is soft.     Tenderness: There is abdominal tenderness in the right lower quadrant, epigastric area, periumbilical area, suprapubic area and left lower quadrant. There is no right CVA tenderness, left CVA tenderness, guarding or rebound. Negative signs include Murphy's sign, Rovsing's sign and McBurney's sign.  Skin:    General: Skin is warm and dry.     Findings: No erythema.  Neurological:     Mental Status: He is alert.  Psychiatric:        Behavior: Behavior normal.     ED Results / Procedures / Treatments   Labs (all labs ordered are listed, but only abnormal results are displayed) Labs Reviewed  COMPREHENSIVE METABOLIC PANEL - Abnormal; Notable for the following components:      Result Value   Glucose, Bld 107 (*)    All other components within normal limits  CBC - Abnormal; Notable for the following components:   RBC 6.14 (*)    Hemoglobin 17.3 (*)    HCT 53.7 (*)    All other components within normal limits  LIPASE, BLOOD  URINALYSIS, ROUTINE W REFLEX MICROSCOPIC    EKG None  Radiology CT ABDOMEN PELVIS W CONTRAST  Result Date: 12/13/2019 CLINICAL DATA:  Abdominal pain and diarrhea for 2 days. Bright red blood per rectum starting last night. Episode of vomiting yesterday. Diverticulitis suspected. EXAM: CT ABDOMEN AND PELVIS WITH CONTRAST TECHNIQUE: Multidetector CT imaging of the abdomen and pelvis was performed using the standard protocol following bolus administration of intravenous contrast. CONTRAST:  OMNIPAQUE IOHEXOL 300 MG/ML  SOLN COMPARISON:  None. FINDINGS: Lower chest: Lung bases are clear. Hepatobiliary: No focal liver abnormality is seen. No gallstones, gallbladder wall thickening, or biliary dilatation. Pancreas: No ductal dilatation or inflammation. Spleen: Normal in size without focal abnormality. Adrenals/Urinary Tract: Normal adrenal glands. No hydronephrosis or perinephric edema. Homogeneous renal enhancement.  Urinary bladder is partially distended and unremarkable. Stomach/Bowel: Stomach is unremarkable. Normal positioning of the ligament of Treitz. No small bowel obstruction or evidence of inflammation. Few fluid-filled nondilated small bowel loops in the left abdomen are nonspecific. Appendix is normal. Equivocal colonic wall thickening involving the descending colon versus nondistention, for example series 2, image 32. No definite pericolonic edema. No significant diverticular disease. Descending and sigmoid colon are decompressed. Vascular/Lymphatic: Abdominal aorta is normal in caliber. The portal vein is patent. Mesenteric vessels are patent. No enlarged lymph nodes in the abdomen or pelvis. Reproductive: Prostate is unremarkable. Other: No free air, free fluid, or intra-abdominal fluid collection. Musculoskeletal: There are no acute or suspicious osseous abnormalities. IMPRESSION: 1. Equivocal colonic wall thickening involving the descending colon  versus nondistention. This may be due to infectious or inflammatory colitis in the setting of diarrhea. No significant diverticular disease. 2. Otherwise no acute abnormality in the abdomen/pelvis. Electronically Signed   By: Keith Rake M.D.   On: 12/13/2019 15:27    Procedures Procedures (including critical care time)  Medications Ordered in ED Medications  dicyclomine (BENTYL) 10 MG capsule (has no administration in time range)  sodium chloride flush (NS) 0.9 % injection 3 mL (0 mLs Intravenous Return to [facility] 12/13/19 1543)  sodium chloride 0.9 % bolus 1,000 mL (1,000 mLs Intravenous New Bag/Given 12/13/19 1520)  ondansetron (ZOFRAN) injection 4 mg (4 mg Intravenous Given 12/13/19 1516)  iohexol (OMNIPAQUE) 300 MG/ML solution 100 mL (100 mLs Intravenous Contrast Given 12/13/19 1502)  dicyclomine (BENTYL) capsule 10 mg (10 mg Oral Given 12/13/19 1603)    ED Course  I have reviewed the triage vital signs and the nursing notes.  Pertinent labs & imaging  results that were available during my care of the patient were reviewed by me and considered in my medical decision making (see chart for details).    MDM Rules/Calculators/A&P                      Patient presenting for evaluation of lower abdominal pains with associated nausea, vomiting and diarrhea.  Then noted bright blood in his stools mostly when wiping but this is only present with bowel movements.  States he thinks he has had issues with hemorrhoids previously.  He is afebrile, vital signs are stable.  He is nontoxic in appearance.  No rebound or guarding noted on examination of the abdomen.  Lab work reviewed and interpreted by myself shows no leukocytosis, elevated hemoglobin and hematocrit which appears baseline and could be suggestive of hemoconcentration, no metabolic derangements, no renal insufficiency.  Lipase, LFTs within normal limits.  UA does not suggest UTI or nephrolithiasis.  Imaging shows possible colonic wall thickening involving the descending colon versus nondistention.  This could be in the setting of infectious or inflammatory colitis, no evidence of diverticulitis or other acute surgical abdominal pathology including obstruction, perforation, appendicitis or cholecystitis.  He was given IV fluids and antiemetic in the ED as well as Bentyl.  On reevaluation he is resting comfortably in no apparent distress.  He is tolerating sips of fluid in the ED and serial abdominal examinations remain benign.  I suspect that he is experiencing gastroenteritis most likely.  We discussed advancing diet slowly, pushing fluids, symptomatic management.  In the absence of any systemic symptoms, fever, or leukocytosis on work-up today do not feel that he requires antibiotics at this time.  Offered Covid testing but he would like to hold off at this time which I think is reasonable.  He will follow up with a PCP for reevaluation of symptoms.  Discussed strict ED return precautions. Patient verbalized  understanding of and agreement with plan and is safe for discharge home at this time.     Final Clinical Impression(s) / ED Diagnoses Final diagnoses:  Lower abdominal pain  Nausea vomiting and diarrhea    Rx / DC Orders ED Discharge Orders         Ordered    ondansetron (ZOFRAN ODT) 4 MG disintegrating tablet  Every 8 hours PRN     12/13/19 1631    dicyclomine (BENTYL) 20 MG tablet  2 times daily PRN     12/13/19 1631    loperamide (IMODIUM) 2 MG capsule  4  times daily PRN     12/13/19 1631    Bacillus Coagulans-Inulin (PROBIOTIC) 1-250 BILLION-MG CAPS  Daily     12/13/19 1631           Jeanie Sewer, PA-C 12/13/19 1636    Milagros Loll, MD 12/19/19 1529

## 2020-03-19 ENCOUNTER — Emergency Department (HOSPITAL_BASED_OUTPATIENT_CLINIC_OR_DEPARTMENT_OTHER)
Admission: EM | Admit: 2020-03-19 | Discharge: 2020-03-19 | Disposition: A | Discharge status: Home / Self Care | Payer: 59 | Attending: Emergency Medicine | Admitting: Emergency Medicine

## 2020-03-19 ENCOUNTER — Other Ambulatory Visit: Payer: Self-pay

## 2020-03-19 ENCOUNTER — Emergency Department (HOSPITAL_BASED_OUTPATIENT_CLINIC_OR_DEPARTMENT_OTHER): Payer: 59

## 2020-03-19 ENCOUNTER — Encounter (HOSPITAL_BASED_OUTPATIENT_CLINIC_OR_DEPARTMENT_OTHER): Payer: Self-pay | Admitting: *Deleted

## 2020-03-19 DIAGNOSIS — Y929 Unspecified place or not applicable: Secondary | ICD-10-CM | POA: Insufficient documentation

## 2020-03-19 DIAGNOSIS — M25562 Pain in left knee: Secondary | ICD-10-CM | POA: Insufficient documentation

## 2020-03-19 DIAGNOSIS — Y939 Activity, unspecified: Secondary | ICD-10-CM | POA: Insufficient documentation

## 2020-03-19 DIAGNOSIS — F17290 Nicotine dependence, other tobacco product, uncomplicated: Secondary | ICD-10-CM | POA: Diagnosis not present

## 2020-03-19 DIAGNOSIS — W208XXA Other cause of strike by thrown, projected or falling object, initial encounter: Secondary | ICD-10-CM | POA: Diagnosis not present

## 2020-03-19 DIAGNOSIS — Y999 Unspecified external cause status: Secondary | ICD-10-CM | POA: Insufficient documentation

## 2020-03-19 MED ORDER — OXYCODONE HCL 5 MG PO TABS
5.0000 mg | ORAL_TABLET | Freq: Once | ORAL | Status: AC
  Administered 2020-03-19: 5 mg via ORAL
  Filled 2020-03-19: qty 1

## 2020-03-19 MED ORDER — DOXYCYCLINE HYCLATE 100 MG PO CAPS
100.0000 mg | ORAL_CAPSULE | Freq: Two times a day (BID) | ORAL | 0 refills | Status: DC
Start: 2020-03-19 — End: 2024-02-15

## 2020-03-19 MED ORDER — ACETAMINOPHEN 500 MG PO TABS
1000.0000 mg | ORAL_TABLET | Freq: Once | ORAL | Status: AC
  Administered 2020-03-19: 1000 mg via ORAL
  Filled 2020-03-19: qty 2

## 2020-03-19 MED ORDER — KETOROLAC TROMETHAMINE 60 MG/2ML IM SOLN
15.0000 mg | Freq: Once | INTRAMUSCULAR | Status: AC
  Administered 2020-03-19: 15 mg via INTRAMUSCULAR
  Filled 2020-03-19: qty 2

## 2020-03-19 NOTE — Discharge Instructions (Signed)
Take 4 over the counter ibuprofen tablets 3 times a day or 2 over-the-counter naproxen tablets twice a day for pain. Also take tylenol 1000mg(2 extra strength) four times a day.    

## 2020-03-19 NOTE — ED Triage Notes (Signed)
C/o left knee x 4 days  Larey Seat between a dock and truck w a major scratch to knee  Increased redness and swelling

## 2020-03-19 NOTE — ED Provider Notes (Signed)
[facility] EMERGENCY DEPARTMENT Provider Note   CSN: [id number] Arrival date & time: 03/19/20  2993     History Chief Complaint  Patient presents with  . Knee Pain    Roy Morris is a 29 y.o. male.  29 yo M with a chief complaints of left knee pain.  The patient was helping a friend move and he did fall in between the moving truck and a concrete structure.  Has a bruise to the medial aspects of the left shin just distal to the knee and a bruise to the left lateral upper leg.  Had a laceration to the lower aspects has had pain and some swelling.  And some difficulty bending the leg.  Denies other injury in the fall.  The history is provided by the patient.  Knee Pain Location:  Knee Time since incident:  4 days Injury: yes   Mechanism of injury: fall   Knee location:  L knee Pain details:    Quality:  Aching, cramping and sharp   Radiates to:  Does not radiate   Severity:  Moderate   Onset quality:  Gradual   Duration:  4 days   Timing:  Constant   Progression:  Worsening Chronicity:  New Dislocation: no   Tetanus status:  Up to date Prior injury to area:  Yes Worsened by:  Bearing weight and activity Ineffective treatments:  None tried Associated symptoms: no fever        History reviewed. No pertinent past medical history.  There are no problems to display for this patient.   History reviewed. No pertinent surgical history.     No family history on file.  Social History   Tobacco Use  . Smoking status: Current Every Day Smoker    Types: Cigars  . Smokeless tobacco: Never Used  Vaping Use  . Vaping Use: Never used  Substance Use Topics  . Alcohol use: Yes    Comment: occ  . Drug use: Yes    Types: Marijuana    Home Medications Prior to Admission medications   Medication Sig Start Date End Date Taking? Authorizing Provider  Bacillus Coagulans-Inulin (PROBIOTIC) 1-250 BILLION-MG CAPS Take 1 capsule by mouth daily. 12/13/19   Fawze, Mina  A, PA-C  dicyclomine (BENTYL) 20 MG tablet Take 1 tablet (20 mg total) by mouth 2 (two) times daily as needed for spasms. 12/13/19   Fawze, Mina A, PA-C  doxycycline (VIBRAMYCIN) 100 MG capsule Take 1 capsule (100 mg total) by mouth 2 (two) times daily. One po bid x 7 days 03/19/20   Melene Plan, DO  ibuprofen (ADVIL,MOTRIN) 800 MG tablet Take 1 tablet (800 mg total) by mouth 3 (three) times daily. 09/30/18   Palumbo, April, MD  loperamide (IMODIUM) 2 MG capsule Take 1 capsule (2 mg total) by mouth 4 (four) times daily as needed for diarrhea or loose stools. 12/13/19   Fawze, Mina A, PA-C  ondansetron (ZOFRAN ODT) 4 MG disintegrating tablet Take 1 tablet (4 mg total) by mouth every 8 (eight) hours as needed for nausea or vomiting. 12/13/19   Michela Pitcher A, PA-C    Allergies    Patient has no known allergies.  Review of Systems   Review of Systems  Constitutional: Negative for chills and fever.  HENT: Negative for congestion and facial swelling.   Eyes: Negative for discharge and visual disturbance.  Respiratory: Negative for shortness of breath.   Cardiovascular: Negative for chest pain and palpitations.  Gastrointestinal: Negative for  abdominal pain, diarrhea and vomiting.  Musculoskeletal: Positive for arthralgias and myalgias.  Skin: Negative for color change and rash.  Neurological: Negative for tremors, syncope and headaches.  Psychiatric/Behavioral: Negative for confusion and dysphoric mood.    Physical Exam Updated Vital Signs BP 128/67 (BP Location: Left Arm)   Pulse 88   Temp 98.9 F (37.2 C) (Oral)   Resp 18   Ht 6\' 3"  (1.905 m)   Wt 111.1 kg   SpO2 99%   BMI 30.62 kg/m   Physical Exam Vitals and nursing note reviewed.  Constitutional:      Appearance: He is well-developed.  HENT:     Head: Normocephalic and atraumatic.  Eyes:     Pupils: Pupils are equal, round, and reactive to light.  Neck:     Vascular: No JVD.  Cardiovascular:     Rate and Rhythm: Normal rate and  regular rhythm.     Heart sounds: No murmur heard.  No friction rub. No gallop.   Pulmonary:     Effort: No respiratory distress.     Breath sounds: No wheezing.  Abdominal:     General: There is no distension.     Tenderness: There is no guarding or rebound.  Musculoskeletal:        General: Tenderness present. Normal range of motion.     Cervical back: Normal range of motion and neck supple.     Comments: Very small effusion to the left knee.  No erythema or warmth of the knee.  I am able to flex the knee passively but not able to make it to 90 degrees due to the discomfort.  He has a healing laceration to the medial aspects of the lower leg about 3 cm distal to the tibial tuberosity.  Mild surrounding erythema. appears to be exquisitely tender no significant warmth or fluctuance.  Skin:    Coloration: Skin is not pale.     Findings: No rash.  Neurological:     Mental Status: He is alert and oriented to person, place, and time.  Psychiatric:        Behavior: Behavior normal.     ED Results / Procedures / Treatments   Labs (all labs ordered are listed, but only abnormal results are displayed) Labs Reviewed - No data to display  EKG None  Radiology DG Knee Complete 4 Views Left  Result Date: 03/19/2020 CLINICAL DATA:  Knee compressed by solid object EXAM: LEFT KNEE - COMPLETE 4+ VIEW COMPARISON:  None. FINDINGS: Frontal, lateral, and bilateral oblique views were obtained. No fracture or dislocation. No joint effusion. Joint spaces appear normal. No erosive change. IMPRESSION: No fracture, dislocation, or joint effusion. No evident arthropathy. Electronically Signed   By: 05/20/2020 III M.D.   On: 03/19/2020 08:41    Procedures Procedures (including critical care time)  Medications Ordered in ED Medications  acetaminophen (TYLENOL) tablet 1,000 mg (1,000 mg Oral Given 03/19/20 0823)  ketorolac (TORADOL) injection 15 mg (15 mg Intramuscular Given 03/19/20 0825)    oxyCODONE (Oxy IR/ROXICODONE) immediate release tablet 5 mg (5 mg Oral Given 03/19/20 05/20/20)    ED Course  I have reviewed the triage vital signs and the nursing notes.  Pertinent labs & imaging results that were available during my care of the patient were reviewed by me and considered in my medical decision making (see chart for details).    MDM Rules/Calculators/A&P  29 yo M with a chief complaints of left leg pain after fall about 4 days ago.  He has a significant amount of tenderness which limits my ability to fully assess the knee for ligamentous injury or meniscal tear.  He does not have a significant effusion though does not have significant erythema to the knee making it much less likely to be septic arthritis.  He did have a wound though it somewhat distal to the joint and seems unlikely to have extended into it upon the initial injury.  With some mild surrounding erythema and some tenderness to palpation I will start him on antibiotics.  Plain film of the knee.  Knee immobilizer crutches PCP follow-up.  Plain film viewed by me without fracture or dislocation.  8:51 AM:  I have discussed the diagnosis/risks/treatment options with the patient and believe the pt to be eligible for discharge home to follow-up with PCP, sports med. We also discussed returning to the ED immediately if new or worsening sx occur. We discussed the sx which are most concerning (e.g., sudden worsening pain, fever, inability to tolerate by mouth) that necessitate immediate return. Medications administered to the patient during their visit and any new prescriptions provided to the patient are listed below.  Medications given during this visit Medications  acetaminophen (TYLENOL) tablet 1,000 mg (1,000 mg Oral Given 03/19/20 0823)  ketorolac (TORADOL) injection 15 mg (15 mg Intramuscular Given 03/19/20 0825)  oxyCODONE (Oxy IR/ROXICODONE) immediate release tablet 5 mg (5 mg Oral Given 03/19/20  6073)     The patient appears reasonably screen and/or stabilized for discharge and I doubt any other medical condition or other [facility] requiring further screening, evaluation, or treatment in the ED at this time prior to discharge.   Final Clinical Impression(s) / ED Diagnoses Final diagnoses:  Acute pain of left knee    Rx / DC Orders ED Discharge Orders         Ordered    doxycycline (VIBRAMYCIN) 100 MG capsule  2 times daily     Discontinue  Reprint     03/19/20 0848           Melene Plan, DO 03/19/20 [phone]

## 2020-04-03 ENCOUNTER — Ambulatory Visit (INDEPENDENT_AMBULATORY_CARE_PROVIDER_SITE_OTHER): Payer: 59 | Admitting: Family Medicine

## 2020-04-03 ENCOUNTER — Other Ambulatory Visit: Payer: Self-pay

## 2020-04-03 ENCOUNTER — Ambulatory Visit: Payer: Self-pay

## 2020-04-03 ENCOUNTER — Encounter: Payer: Self-pay | Admitting: Family Medicine

## 2020-04-03 VITALS — BP 126/76 | HR 84 | Ht 75.0 in | Wt 240.0 lb

## 2020-04-03 DIAGNOSIS — M25562 Pain in left knee: Secondary | ICD-10-CM | POA: Diagnosis not present

## 2020-04-03 MED ORDER — IBUPROFEN 600 MG PO TABS: 600.0000 mg | ORAL_TABLET | Freq: Three times a day (TID) | ORAL | 1 refills | Status: DC | PRN

## 2020-04-03 NOTE — Patient Instructions (Signed)
Nice to meet you Please try to alternate heat and ice  Please try compression  Please try the ibuprofen as needed   Please send me a message in MyChart with any questions or updates.  Please see me back in 3 weeks.   --Dr. Jordan Likes

## 2020-04-03 NOTE — Progress Notes (Signed)
Roy Morris - 29 y.o. male MRN [id number]  Date of birth: 1991/03/04  SUBJECTIVE:  Including CC & ROS.  Chief Complaint  Patient presents with  . Knee Injury    left x 03-22-2020    Roy Morris is a 29 y.o. male that is presenting with left knee pain after an injury sustained at work.  He fell and hit the medial aspect of his knee against a piece of equipment.  Since that time he has had an abnormality where injuries were sustained.  He has had pain with flexion.  No history of similar pain.  Pain is localized to this area.  Denies any mechanical symptoms..  Independent review of the left knee x-ray from 7/12 shows no acute abnormality.   Review of Systems See HPI   HISTORY: Past Medical, Surgical, Social, and Family History Reviewed & Updated per EMR.   Pertinent Historical Findings include:  No past medical history on file.  No past surgical history on file.  No family history on file.  Social History   Socioeconomic History  . Marital status: Married    Spouse name: Not on file  . Number of children: Not on file  . Years of education: Not on file  . Highest education level: Not on file  Occupational History  . Not on file  Tobacco Use  . Smoking status: Current Every Day Smoker    Types: Cigars  . Smokeless tobacco: Never Used  Vaping Use  . Vaping Use: Never used  Substance and Sexual Activity  . Alcohol use: Yes    Comment: occ  . Drug use: Yes    Types: Marijuana  . Sexual activity: Not on file  Other Topics Concern  . Not on file  Social History Narrative  . Not on file   Social Determinants of Health   Financial Resource Strain:   . Difficulty of Paying Living Expenses:   Food Insecurity:   . Worried About Programme researcher, broadcasting/film/video in the Last Year:   . Barista in the Last Year:   Transportation Needs:   . Freight forwarder (Medical):   Marland Kitchen Lack of Transportation (Non-Medical):   Physical Activity:   . Days of Exercise per Week:   . Minutes  of Exercise per Session:   Stress:   . Feeling of Stress :   Social Connections:   . Frequency of Communication with Friends and Family:   . Frequency of Social Gatherings with Friends and Family:   . Attends Religious Services:   . Active Member of Clubs or Organizations:   . Attends Banker Meetings:   Marland Kitchen Marital Status:   Intimate Partner Violence:   . Fear of Current or Ex-Partner:   . Emotionally Abused:   Marland Kitchen Physically Abused:   . Sexually Abused:      PHYSICAL EXAM:  VS: BP 126/76   Pulse 84   Ht 6\' 3"  (1.905 m)   Wt (!) 240 lb (108.9 kg)   BMI 30.00 kg/m  Physical Exam Gen: NAD, alert, cooperative with exam, well-appearing MSK:  Left knee: No obvious effusion. Obvious deformity in the medial compartment. Tenderness palpation over this deformity. Pain with flexion. No instability with valgus or varus stress testing. Neurovascular intact  Limited ultrasound: Left knee:  No effusion. Normal-appearing quadricep and patellar tendon. Normal-appearing medial meniscus. Overlying soft tissue in the medial compartment has cobblestoning to suggest effusion and hematoma present.  Hyperemia associated in this area.  Summary: Soft tissue hematoma and inflammatory change of this area from impact.  Ultrasound and interpretation by Clare Gandy, MD    ASSESSMENT & PLAN:   Acute pain of left knee Pain is been ongoing since injury sustained at work last week.  Ultrasound revealing for soft tissue changes. -Counseled on home exercise therapy and supportive care. -Ibuprofen. -Provided work note. -Could consider further imaging if needed.

## 2020-04-03 NOTE — Assessment & Plan Note (Signed)
Pain is been ongoing since injury sustained at work last week.  Ultrasound revealing for soft tissue changes. -Counseled on home exercise therapy and supportive care. -Ibuprofen. -Provided work note. -Could consider further imaging if needed.

## 2020-04-26 ENCOUNTER — Ambulatory Visit: Payer: 59 | Admitting: Family Medicine

## 2020-07-15 ENCOUNTER — Other Ambulatory Visit: Payer: Self-pay

## 2020-07-15 ENCOUNTER — Emergency Department (HOSPITAL_BASED_OUTPATIENT_CLINIC_OR_DEPARTMENT_OTHER): Payer: Self-pay

## 2020-07-15 ENCOUNTER — Emergency Department (HOSPITAL_BASED_OUTPATIENT_CLINIC_OR_DEPARTMENT_OTHER)
Admission: EM | Admit: 2020-07-15 | Discharge: 2020-07-15 | Disposition: A | Discharge status: Home / Self Care | Payer: Self-pay | Attending: Emergency Medicine | Admitting: Emergency Medicine

## 2020-07-15 DIAGNOSIS — S41151A Open bite of right upper arm, initial encounter: Secondary | ICD-10-CM | POA: Insufficient documentation

## 2020-07-15 DIAGNOSIS — W540XXA Bitten by dog, initial encounter: Secondary | ICD-10-CM | POA: Insufficient documentation

## 2020-07-15 DIAGNOSIS — S41111A Laceration without foreign body of right upper arm, initial encounter: Secondary | ICD-10-CM

## 2020-07-15 DIAGNOSIS — F17210 Nicotine dependence, cigarettes, uncomplicated: Secondary | ICD-10-CM | POA: Insufficient documentation

## 2020-07-15 DIAGNOSIS — Z23 Encounter for immunization: Secondary | ICD-10-CM | POA: Insufficient documentation

## 2020-07-15 DIAGNOSIS — Y9389 Activity, other specified: Secondary | ICD-10-CM | POA: Insufficient documentation

## 2020-07-15 MED ORDER — AMOXICILLIN-POT CLAVULANATE 875-125 MG PO TABS
1.0000 | ORAL_TABLET | Freq: Two times a day (BID) | ORAL | 0 refills | Status: AC
Start: 2020-07-15 — End: 2020-07-22

## 2020-07-15 MED ORDER — OXYCODONE-ACETAMINOPHEN 5-325 MG PO TABS
1.0000 | ORAL_TABLET | Freq: Once | ORAL | Status: AC
  Administered 2020-07-15: 1 via ORAL
  Filled 2020-07-15: qty 1

## 2020-07-15 MED ORDER — LIDOCAINE-EPINEPHRINE (PF) 2 %-1:200000 IJ SOLN
10.0000 mL | Freq: Once | INTRAMUSCULAR | Status: AC
  Administered 2020-07-15: 10 mL
  Filled 2020-07-15: qty 20

## 2020-07-15 MED ORDER — AMOXICILLIN-POT CLAVULANATE 875-125 MG PO TABS
1.0000 | ORAL_TABLET | Freq: Once | ORAL | Status: AC
  Administered 2020-07-15: 1 via ORAL
  Filled 2020-07-15: qty 1

## 2020-07-15 MED ORDER — TETANUS-DIPHTH-ACELL PERTUSSIS 5-2.5-18.5 LF-MCG/0.5 IM SUSY
0.5000 mL | PREFILLED_SYRINGE | Freq: Once | INTRAMUSCULAR | Status: AC
  Administered 2020-07-15: 0.5 mL via INTRAMUSCULAR
  Filled 2020-07-15: qty 0.5

## 2020-07-15 NOTE — Discharge Instructions (Addendum)
Like we discussed, I am prescribing you an antibiotic called Augmentin.  You are going to take this twice a day for the next 7 days.  Please do not stop taking this early.  This will help prevent infection.  Please follow-up with your friend who is the owner of the dog.  It is important that you get the shot records of the dog so you can make sure it is truly up-to-date on his vaccinations.  If you are concerned that the dog was not up-to-date on its vaccinations, you need to return to the emergency department to get the rabies vaccination.  I have attached some information on wound care for your arm.  Please have your stitches removed in 10 to 14 days.  You can continue take Tylenol and ibuprofen for management of your pain.  It was a pleasure to meet you both.

## 2020-07-15 NOTE — ED Notes (Signed)
Dressing applied to right upper arm for discharge home.

## 2020-07-15 NOTE — ED Provider Notes (Signed)
[facility] EMERGENCY DEPARTMENT Provider Note   CSN: [id number] Arrival date & time: 07/15/20  1600     History Chief Complaint  Patient presents with  . Animal Bite    Roy Morris is a 29 y.o. male.  HPI  Patient presents today due to a dog bite. Patient states his friend had a pit bull on a leash that jumped up and bit him along the right medial upper arm. Patient has multiple skin tears in the region with no active bleeding. Patient reports exquisite pain in the region. Pain worsens with palpation and movement. Patient states that he shot and killed the dog onsite. He spoke to his friend who states that the dog [place] up to date on its vaccinations.  No numbness, tingling, weakness.    No past medical history on file.  Patient Active Problem List   Diagnosis Date Noted  . Acute pain of left knee 04/03/2020    No past surgical history on file.     No family history on file.  Social History   Tobacco Use  . Smoking status: Current Every Day Smoker    Types: Cigars  . Smokeless tobacco: Never Used  Vaping Use  . Vaping Use: Never used  Substance Use Topics  . Alcohol use: Yes    Comment: occ  . Drug use: Yes    Types: Marijuana    Home Medications Prior to Admission medications   Medication Sig Start Date End Date Taking? Authorizing Provider  Bacillus Coagulans-Inulin (PROBIOTIC) 1-250 BILLION-MG CAPS Take 1 capsule by mouth daily. 12/13/19   Fawze, Mina A, PA-C  dicyclomine (BENTYL) 20 MG tablet Take 1 tablet (20 mg total) by mouth 2 (two) times daily as needed for spasms. 12/13/19   Fawze, Mina A, PA-C  doxycycline (VIBRAMYCIN) 100 MG capsule Take 1 capsule (100 mg total) by mouth 2 (two) times daily. One po bid x 7 days 03/19/20   Melene Plan, DO  ibuprofen (ADVIL) 600 MG tablet Take 1 tablet (600 mg total) by mouth every 8 (eight) hours as needed. 04/03/20   Myra Rude, MD  loperamide (IMODIUM) 2 MG capsule Take 1 capsule (2 mg total) by mouth 4  (four) times daily as needed for diarrhea or loose stools. 12/13/19   Fawze, Mina A, PA-C  ondansetron (ZOFRAN ODT) 4 MG disintegrating tablet Take 1 tablet (4 mg total) by mouth every 8 (eight) hours as needed for nausea or vomiting. 12/13/19   Michela Pitcher A, PA-C    Allergies    Patient has no known allergies.  Review of Systems   Review of Systems  Musculoskeletal: Positive for myalgias.  Skin: Positive for wound.  Neurological: Negative for weakness and numbness.    Physical Exam Updated Vital Signs BP (!) 153/77 (BP Location: Left Arm)   Pulse (!) 115   Temp 98.4 F (36.9 C) (Oral)   Resp 18   Ht 6\' 3"  (1.905 m)   Wt 113.4 kg   SpO2 100%   BMI 31.25 kg/m   Physical Exam Vitals and nursing note reviewed.  Constitutional:      General: He is not in acute distress.    Appearance: He is well-developed.  HENT:     Head: Normocephalic and atraumatic.     Right Ear: External ear normal.     Left Ear: External ear normal.  Eyes:     General: No scleral icterus.       Right eye: No discharge.  Left eye: No discharge.     Conjunctiva/sclera: Conjunctivae normal.  Neck:     Trachea: No tracheal deviation.  Cardiovascular:     Rate and Rhythm: Normal rate.  Pulmonary:     Effort: Pulmonary effort is normal. No respiratory distress.     Breath sounds: No stridor.  Abdominal:     General: There is no distension.  Musculoskeletal:        General: No swelling or deformity.     Cervical back: Neck supple.     Comments: No tenderness appreciated along the right shoulder, right elbow, right wrist, right hand. Full range of motion of all the joints of the right upper extremity. Distal sensation intact. 2+ radial pulses.  Skin:    General: Skin is warm and dry.     Findings: No rash.     Comments: Please see images below regarding the right upper arm.  Neurological:     Mental Status: He is alert.     Cranial Nerves: Cranial nerve deficit: no gross deficits.     ED  Results / Procedures / Treatments   Labs (all labs ordered are listed, but only abnormal results are displayed) Labs Reviewed - No data to display  EKG None  Radiology DG Elbow Complete Right  Result Date: 07/15/2020 CLINICAL DATA:  Dog bite, pain, decreased range of motion EXAM: RIGHT ELBOW - COMPLETE 3+ VIEW COMPARISON:  04/26/2013 FINDINGS: Frontal, bilateral oblique, lateral views of the right elbow are obtained. No fracture, subluxation, or dislocation. No radiopaque foreign body. Subcutaneous gas within the distal upper arm consistent with history of animal bite. IMPRESSION: 1. Subcutaneous gas consistent with history of animal bite. 2. No fracture or radiopaque foreign body. Electronically Signed   By: Sharlet Salina M.D.   On: 07/15/2020 17:30   DG Humerus Right  Result Date: 07/15/2020 CLINICAL DATA:  Dog bite EXAM: RIGHT HUMERUS - 2+ VIEW COMPARISON:  None. FINDINGS: There is no evidence of fracture or other focal bone lesions. Subcutaneous emphysema seen along the medial aspect of the distal humerus. IMPRESSION: No acute osseous abnormality. Electronically Signed   By: Jonna Clark M.D.   On: 07/15/2020 17:31    Procedures .Marland KitchenLaceration Repair  Date/Time: 07/15/2020 6:47 PM Performed by: Placido Sou, PA-C Authorized by: Placido Sou, PA-C   Consent:    Consent obtained:  Verbal   Consent given by:  Patient   Risks discussed:  Infection, pain and poor cosmetic result Anesthesia (see MAR for exact dosages):    Anesthesia method:  Local infiltration   Local anesthetic:  Lidocaine 2% WITH epi Laceration details:    Location:  Shoulder/arm   Shoulder/arm location:  R upper arm   Length (cm):  4 Repair type:    Repair type:  Intermediate Pre-procedure details:    Preparation:  Patient was prepped and draped in usual sterile fashion Exploration:    Hemostasis achieved with:  Direct pressure   Wound exploration: wound explored through full range of motion     Treatment:    Area cleansed with:  Betadine and saline   Amount of cleaning:  Extensive   Irrigation method:  Pressure wash   Visualized foreign bodies/material removed: no   Skin repair:    Repair method:  Sutures   Suture size:  4-0   Suture material:  Prolene   Suture technique:  Simple interrupted   Number of sutures:  3 Approximation:    Approximation:  Loose Post-procedure details:    Dressing:  Open (no dressing)   Patient tolerance of procedure:  Tolerated well, no immediate complications .Marland KitchenLaceration Repair  Date/Time: 07/15/2020 6:48 PM Performed by: Placido Sou, PA-C Authorized by: Placido Sou, PA-C   Consent:    Consent obtained:  Verbal   Consent given by:  Patient   Risks discussed:  Infection, poor cosmetic result and pain Anesthesia (see MAR for exact dosages):    Anesthesia method:  Local infiltration   Local anesthetic:  Lidocaine 2% WITH epi Laceration details:    Location:  Shoulder/arm   Shoulder/arm location:  R upper arm   Length (cm):  1 Repair type:    Repair type:  Intermediate Pre-procedure details:    Preparation:  Patient was prepped and draped in usual sterile fashion Exploration:    Hemostasis achieved with:  Direct pressure   Wound exploration: wound explored through full range of motion   Treatment:    Area cleansed with:  Betadine and saline   Amount of cleaning:  Extensive   Irrigation method:  Pressure wash Skin repair:    Repair method:  Sutures   Suture size:  4-0   Suture material:  Prolene   Suture technique:  Simple interrupted   Number of sutures:  1 Approximation:    Approximation:  Loose Post-procedure details:    Dressing:  Open (no dressing)   Patient tolerance of procedure:  Tolerated well, no immediate complications    Medications Ordered in ED Medications  oxyCODONE-acetaminophen (PERCOCET/ROXICET) 5-325 MG per tablet 1 tablet (1 tablet Oral Given 07/15/20 1639)  lidocaine-EPINEPHrine (XYLOCAINE  W/EPI) 2 %-1:200000 (PF) injection 10 mL (10 mLs Infiltration Given by Other 07/15/20 1804)  Tdap (BOOSTRIX) injection 0.5 mL (0.5 mLs Intramuscular Given 07/15/20 1803)  amoxicillin-clavulanate (AUGMENTIN) 875-125 MG per tablet 1 tablet (1 tablet Oral Given 07/15/20 1803)   ED Course  I have reviewed the triage vital signs and the nursing notes.  Pertinent labs & imaging results that were available during my care of the patient were reviewed by me and considered in my medical decision making (see chart for details).  Clinical Course as of Jul 15 1738  Wynelle Link Jul 15, 2020  1739 1. Subcutaneous gas consistent with history of animal bite. 2. No fracture or radiopaque foreign body.  DG Elbow Complete Right [LJ]  1739 No acute osseous abnormality.  DG Humerus Right [LJ]    Clinical Course User Index [LJ] Placido Sou, PA-C   MDM Rules/Calculators/A&P                          Patient is a 29 year old male who presents to the emergency department after a dog bite that occurred just prior to arrival.  Patient states the pitbull was owned by his friend and his friend confirmed that it is up-to-date on its immunizations.  X-rays were obtained of the region showing subcutaneous gas consistent with his history of animal bite but otherwise no acute fracture or radiopaque foreign bodies.  Wounds were cleaned extensively by the nursing staff and then again by myself.  Both wounds were closed loosely with 4-0 Prolene sutures.  Patient tolerated the procedure well.  His Tdap was updated in the ED today.  He was given a Percocet for pain.  We discussed wound care in length.  Continued use of Tylenol and ibuprofen for pain.  Suture removal in 10 to 14 days.  He was prescribed Augmentin for the next 7 days.  He was given his first  dose today.  Recommended returning to the ER with any new or worsening symptoms.  His questions were answered and he was amicable at the time of discharge.  Final Clinical  Impression(s) / ED Diagnoses Final diagnoses:  Dog bite, initial encounter  Laceration of right upper extremity, initial encounter    Rx / DC Orders ED Discharge Orders         Ordered    amoxicillin-clavulanate (AUGMENTIN) 875-125 MG tablet  2 times daily        07/15/20 1845           Placido SouJoldersma, Elsia Las[place], PA-C 07/15/20 1851    Pricilla LovelessGoldston, Scott, MD 07/16/20 [phone]

## 2020-07-15 NOTE — ED Notes (Signed)
Pt shirt cut away from arm per pt request, pt placed in hospital gown to prepare for xray.  Dressing intact to right upper arm, no breakthrough bleeding.

## 2020-07-15 NOTE — ED Triage Notes (Signed)
Reports he walked up to a friend to shake his hand while the mans dog was on a leash.  The Pitt butt jumped up biting him on his right upper arm.  Reports he had to shoot the dog to get him to let go.  Reports a large chunk of his arm is missing and he can see muscle.

## 2022-12-22 ENCOUNTER — Encounter: Payer: Self-pay | Admitting: *Deleted

## 2024-01-07 ENCOUNTER — Ambulatory Visit: Admitting: Internal Medicine

## 2024-01-07 ENCOUNTER — Encounter: Payer: Self-pay | Admitting: Internal Medicine

## 2024-01-07 VITALS — BP 119/69 | HR 70 | Temp 97.6°F | Resp 18 | Ht 76.58 in | Wt 275.4 lb

## 2024-01-07 DIAGNOSIS — K5900 Constipation, unspecified: Secondary | ICD-10-CM | POA: Insufficient documentation

## 2024-01-07 DIAGNOSIS — K219 Gastro-esophageal reflux disease without esophagitis: Secondary | ICD-10-CM | POA: Insufficient documentation

## 2024-01-07 MED ORDER — OMEPRAZOLE 20 MG PO CPDR: 20.0000 mg | DELAYED_RELEASE_CAPSULE | Freq: Every day | ORAL | 1 refills | Status: AC | PRN

## 2024-01-07 NOTE — Patient Instructions (Signed)
 Colace 100mg  - take 1 capsule 2 times a day as needed for constipation

## 2024-01-07 NOTE — Progress Notes (Signed)
 [facility] [address] [place] [address] Dept: [phone] Dept Fax: [phone]  New Patient Office Visit  Subjective:   Roy Morris 1990-09-26 01/07/2024  Chief Complaint  Patient presents with   Establish Care    Pt c/o of blood in stool after eating pork. Pt is fasting today   HM due- Prevnar 20 vaccine, HIV and Hep C screening     HPI: Roy Morris presents today to establish care at [facility] [employer]. Introduced to Publishing rights manager role and practice setting.  All questions answered.  Concerns: See below    Discussed the use of AI scribe software for clinical note transcription with the patient, who gave verbal consent to proceed.  History of Present Illness   Roy Morris is a 33 year old male who presents with blood in stool associated with dietary intake.  He has experienced blood in his stool for the past couple of years, particularly after consuming pork or beef. The blood is bright red and can appear both in the toilet and on the tissue when wiping. He associates the occurrence of blood with episodes of constipation and straining during bowel movements, which he believes are triggered by eating pork and beef. No abdominal pain, fever, nausea, vomiting, diarrhea, or urinary symptoms. He recalls a past incident where he had to seek medical attention for what he believes was a boil or hemorrhoid that required treatment.  He uses MiraLAX occasionally to help with constipation.   He experiences symptoms of acid reflux, which he manages with omeprazole  taken as needed. He identifies lemonade as a trigger for his reflux symptoms.      The following portions of the patient's history were reviewed and updated as appropriate: past medical history, past surgical history, family history, social history, allergies, medications, and problem list.   Patient Active Problem List   Diagnosis Date Noted    Acute pain of left knee 04/03/2020   History reviewed. No pertinent past medical history. History reviewed. No pertinent surgical history. Family History  Problem Relation Age of Onset   Hypertension Sister    Diabetes Sister    Cancer Maternal Grandmother    Cancer Paternal Grandmother     Current Outpatient Medications:    dicyclomine  (BENTYL ) 20 MG tablet, Take 1 tablet (20 mg total) by mouth 2 (two) times daily as needed for spasms., Disp: 10 tablet, Rfl: 0   omeprazole  (PRILOSEC) 20 MG capsule, Take 1 capsule (20 mg total) by mouth daily as needed (acid reflux)., Disp: 90 capsule, Rfl: 1   ondansetron  (ZOFRAN  ODT) 4 MG disintegrating tablet, Take 1 tablet (4 mg total) by mouth every 8 (eight) hours as needed for nausea or vomiting., Disp: 10 tablet, Rfl: 0   Bacillus Coagulans-Inulin (PROBIOTIC) 1-250 BILLION-MG CAPS, Take 1 capsule by mouth daily. (Patient not taking: Reported on 01/07/2024), Disp: 30 capsule, Rfl: 0   doxycycline  (VIBRAMYCIN ) 100 MG capsule, Take 1 capsule (100 mg total) by mouth 2 (two) times daily. One po bid x 7 days (Patient not taking: Reported on 01/07/2024), Disp: 14 capsule, Rfl: 0   ibuprofen  (ADVIL ) 600 MG tablet, Take 1 tablet (600 mg total) by mouth every 8 (eight) hours as needed. (Patient not taking: Reported on 01/07/2024), Disp: 30 tablet, Rfl: 1   loperamide  (IMODIUM ) 2 MG capsule, Take 1 capsule (2 mg total) by mouth 4 (four) times daily as needed for diarrhea or loose stools. (Patient not taking: Reported on 01/07/2024),  Disp: 12 capsule, Rfl: 0 No Known Allergies  ROS: A complete ROS was performed with pertinent positives/negatives noted in the HPI. The remainder of the ROS are negative.   Objective:   Today's Vitals   01/07/24 0925  BP: 119/69  Pulse: 70  Resp: 18  Temp: 97.6 F (36.4 C)  TempSrc: Temporal  SpO2: 100%  Weight: 275 lb 6.4 oz (124.9 kg)  Height: 6' 4.58" (1.945 m)  PainSc: 0-No pain    GENERAL: Well-appearing, in NAD. Well  nourished.  SKIN: Pink, warm and dry. No rash, lesion, ulceration, or ecchymoses.  NECK: Trachea midline. Full ROM w/o pain or tenderness. No lymphadenopathy.  RESPIRATORY: Chest wall symmetrical. Respirations even and non-labored. Breath sounds clear to auscultation bilaterally.  CARDIAC: S1, S2 present, regular rate and rhythm. Peripheral pulses 2+ bilaterally.  GI: Abdomen soft, non-tender. Normoactive bowel sounds.  EXTREMITIES: Without clubbing, cyanosis, or edema.  NEUROLOGIC:  Steady, even gait.  PSYCH/MENTAL STATUS: Alert, oriented x 3. Cooperative, appropriate mood and affect.   Health Maintenance Due  Topic Date Due   HIV Screening  Never done   Hepatitis C Screening  Never done   Pneumococcal Vaccine 58-60 Years old (1 of 2 - PCV) Never done    No results found for any visits on 01/07/24.  Assessment & Plan:  Assessment and Plan    Constipation  Intermittent rectal bleeding likely due to hemorrhoids exacerbated by straining with constipation. Symptoms resolve with dietary modifications. - Recommend Colace 100 mg, one capsule twice daily as needed. - Advise continued use of MiraLAX as needed.  Gastroesophageal reflux disease (GERD) Intermittent GERD symptoms triggered by acidic foods. Managed with as-needed omeprazole . - Prescribe omeprazole  daily as needed. - Advise use of Tums or Pepto Bismol if symptoms persist.        No orders of the defined types were placed in this encounter.  Meds ordered this encounter  Medications   omeprazole  (PRILOSEC) 20 MG capsule    Sig: Take 1 capsule (20 mg total) by mouth daily as needed (acid reflux).    Dispense:  90 capsule    Refill:  1    Supervising Provider:   Catheryn Cluck [[id number]]    Return in about 6 weeks (around 02/18/2024) for Annual Physical Exam with fasting lab work.   Gavin Kast, FNP

## 2024-02-15 ENCOUNTER — Ambulatory Visit (INDEPENDENT_AMBULATORY_CARE_PROVIDER_SITE_OTHER): Admitting: Internal Medicine

## 2024-02-15 ENCOUNTER — Encounter: Payer: Self-pay | Admitting: Internal Medicine

## 2024-02-15 VITALS — BP 128/74 | HR 85 | Temp 98.6°F | Ht 76.0 in | Wt 279.8 lb

## 2024-02-15 DIAGNOSIS — Z114 Encounter for screening for human immunodeficiency virus [HIV]: Secondary | ICD-10-CM | POA: Diagnosis not present

## 2024-02-15 DIAGNOSIS — Z0000 Encounter for general adult medical examination without abnormal findings: Secondary | ICD-10-CM

## 2024-02-15 DIAGNOSIS — Z1159 Encounter for screening for other viral diseases: Secondary | ICD-10-CM | POA: Diagnosis not present

## 2024-02-15 LAB — CBC WITH DIFFERENTIAL/PLATELET
Basophils Absolute: 0.1 10*3/uL (ref 0.0–0.1)
Basophils Relative: 0.8 % (ref 0.0–3.0)
Eosinophils Absolute: 0.2 10*3/uL (ref 0.0–0.7)
Eosinophils Relative: 3.6 % (ref 0.0–5.0)
HCT: 48.2 % (ref 39.0–52.0)
Hemoglobin: 16 g/dL (ref 13.0–17.0)
Lymphocytes Relative: 25.5 % (ref 12.0–46.0)
Lymphs Abs: 1.7 10*3/uL (ref 0.7–4.0)
MCHC: 33.2 g/dL (ref 30.0–36.0)
MCV: 83.2 fl (ref 78.0–100.0)
Monocytes Absolute: 0.6 10*3/uL (ref 0.1–1.0)
Monocytes Relative: 8.1 % (ref 3.0–12.0)
Neutro Abs: 4.2 10*3/uL (ref 1.4–7.7)
Neutrophils Relative %: 62 % (ref 43.0–77.0)
Platelets: 359 10*3/uL (ref 150.0–400.0)
RBC: 5.79 Mil/uL (ref 4.22–5.81)
RDW: 13.9 % (ref 11.5–15.5)
WBC: 6.8 10*3/uL (ref 4.0–10.5)

## 2024-02-15 LAB — COMPREHENSIVE METABOLIC PANEL WITH GFR
ALT: 22 U/L (ref 0–53)
AST: 16 U/L (ref 0–37)
Albumin: 4.6 g/dL (ref 3.5–5.2)
Alkaline Phosphatase: 64 U/L (ref 39–117)
BUN: 14 mg/dL (ref 6–23)
CO2: 27 meq/L (ref 19–32)
Calcium: 9.9 mg/dL (ref 8.4–10.5)
Chloride: 104 meq/L (ref 96–112)
Creatinine, Ser: 1.04 mg/dL (ref 0.40–1.50)
GFR: 93.24 mL/min (ref >60.00)
Glucose, Bld: 97 mg/dL (ref 70–99)
Potassium: 4.7 meq/L (ref 3.5–5.1)
Sodium: 139 meq/L (ref 135–145)
Total Bilirubin: 0.3 mg/dL (ref 0.2–1.2)
Total Protein: 7.1 g/dL (ref 6.0–8.3)

## 2024-02-15 LAB — LIPID PANEL
Cholesterol: 215 mg/dL — ABNORMAL HIGH (ref 0–200)
HDL: 42.4 mg/dL (ref >39.00)
LDL Cholesterol: 107 mg/dL — ABNORMAL HIGH (ref 0–99)
NonHDL: 172.9
Total CHOL/HDL Ratio: 5
Triglycerides: 329 mg/dL — ABNORMAL HIGH (ref 0.0–149.0)
VLDL: 65.8 mg/dL — ABNORMAL HIGH (ref 0.0–40.0)

## 2024-02-15 NOTE — Progress Notes (Signed)
 Subjective:   Roy Morris 02/03/1991 02/15/2024  CC: Chief Complaint  Patient presents with   Annual Exam    Fasting     HPI: Roy Morris is a 33 y.o. male who presents for a routine health maintenance exam.  Labs collected at time of visit.   HEALTH SCREENINGS: - PSA (50+): Not applicable  No results found for: "PSA1", "PSA"   - Colonoscopy (45+): Not applicable  - AAA Screening: Not applicable  Men age 73-75 who have ever smoked - Lung Cancer screening with low-dose CT: Not applicable-  Adults age 31-80 who are current cigarette smokers or quit within the last 15 years. Must have 20 pack year history.   Depression and Anxiety Screen done today and results listed below:     02/15/2024    8:34 AM 01/07/2024    9:54 AM  Depression screen PHQ 2/9  Decreased Interest 0 1  Down, Depressed, Hopeless 3 0  PHQ - 2 Score 3 1  Altered sleeping 1 0  Tired, decreased energy 1 0  Change in appetite 1 0  Feeling bad or failure about yourself  2 0  Trouble concentrating 0 0  Moving slowly or fidgety/restless 3 0  Suicidal thoughts 0 0  PHQ-9 Score 11 1  Difficult doing work/chores Not difficult at all Not difficult at all      02/15/2024    8:35 AM 01/07/2024    9:54 AM  GAD 7 : Generalized Anxiety Score  Nervous, Anxious, on Edge 3 3  Control/stop worrying 3 3  Worry too much - different things 3 3  Trouble relaxing 3 2  Restless 3 0  Easily annoyed or irritable 3 2  Afraid - awful might happen 3 2  Total GAD 7 Score 21 15  Anxiety Difficulty Somewhat difficult Somewhat difficult    IMMUNIZATIONS:  - Tdap: Tetanus vaccination status reviewed: last tetanus booster within 10 years. - Influenza: Postponed to flu season    Past medical history, surgical history, medications, allergies, family history and social history reviewed with patient today and changes made to appropriate areas of the chart.   Past Medical History:  Diagnosis Date   Allergy    Anxiety     Arthritis    Depression    GERD (gastroesophageal reflux disease)     History reviewed. No pertinent surgical history.  Current Outpatient Medications on File Prior to Visit  Medication Sig   omeprazole  (PRILOSEC) 20 MG capsule Take 1 capsule (20 mg total) by mouth daily as needed (acid reflux).   Bacillus Coagulans-Inulin (PROBIOTIC) 1-250 BILLION-MG CAPS Take 1 capsule by mouth daily. (Patient not taking: Reported on 02/15/2024)   dicyclomine  (BENTYL ) 20 MG tablet Take 1 tablet (20 mg total) by mouth 2 (two) times daily as needed for spasms. (Patient not taking: Reported on 02/15/2024)   doxycycline  (VIBRAMYCIN ) 100 MG capsule Take 1 capsule (100 mg total) by mouth 2 (two) times daily. One po bid x 7 days (Patient not taking: Reported on 01/07/2024)   ibuprofen  (ADVIL ) 600 MG tablet Take 1 tablet (600 mg total) by mouth every 8 (eight) hours as needed. (Patient not taking: Reported on 01/07/2024)   loperamide  (IMODIUM ) 2 MG capsule Take 1 capsule (2 mg total) by mouth 4 (four) times daily as needed for diarrhea or loose stools. (Patient not taking: Reported on 02/15/2024)   ondansetron  (ZOFRAN  ODT) 4 MG disintegrating tablet Take 1 tablet (4 mg total) by mouth every 8 (eight) hours as needed  for nausea or vomiting.   No current facility-administered medications on file prior to visit.    No Known Allergies   Social History   Socioeconomic History   Marital status: Married    Spouse name: Not on file   Number of children: Not on file   Years of education: Not on file   Highest education level: GED or equivalent  Occupational History   Not on file  Tobacco Use   Smoking status: Every Day    Current packs/day: 0.50    Average packs/day: 0.5 packs/day for 15.0 years (7.5 ttl pk-yrs)    Types: Cigars, Cigarettes   Smokeless tobacco: Never  Vaping Use   Vaping status: Never Used  Substance and Sexual Activity   Alcohol use: Yes    Alcohol/week: 4.0 standard drinks of alcohol    Types: 4  Shots of liquor per week    Comment: occ   Drug use: Yes    Frequency: 7.0 times per week    Types: Marijuana   Sexual activity: Yes    Birth control/protection: None  Other Topics Concern   Not on file  Social History Narrative   Not on file   Social Drivers of Health   Financial Resource Strain: High Risk (02/14/2024)   Overall Financial Resource Strain (CARDIA)    Difficulty of Paying Living Expenses: Very hard  Food Insecurity: Food Insecurity Present (02/14/2024)   Hunger Vital Sign    Worried About Running Out of Food in the Last Year: Never true    Ran Out of Food in the Last Year: Sometimes true  Transportation Needs: No Transportation Needs (02/14/2024)   PRAPARE - Administrator, Civil Service (Medical): No    Lack of Transportation (Non-Medical): No  Physical Activity: Unknown (02/14/2024)   Exercise Vital Sign    Days of Exercise per Week: 0 days    Minutes of Exercise per Session: Not on file  Stress: Stress Concern Present (02/14/2024)   [employer] of Occupational Health - Occupational Stress Questionnaire    Feeling of Stress : Very much  Social Connections: Moderately Integrated (02/14/2024)   Social Connection and Isolation Panel [NHANES]    Frequency of Communication with Friends and Family: More than three times a week    Frequency of Social Gatherings with Friends and Family: Once a week    Attends Religious Services: 1 to 4 times per year    Active Member of [employer] or Organizations: No    Attends Engineer, structural: Not on file    Marital Status: Married  Catering manager Violence: Not on file   Social History   Tobacco Use  Smoking Status Every Day   Current packs/day: 0.50   Average packs/day: 0.5 packs/day for 15.0 years (7.5 ttl pk-yrs)   Types: Cigars, Cigarettes  Smokeless Tobacco Never   Social History   Substance and Sexual Activity  Alcohol Use Yes   Alcohol/week: 4.0 standard drinks of alcohol   Types: 4 Shots of  liquor per week   Comment: occ     Family History  Problem Relation Age of Onset   Hypertension Sister    Diabetes Sister    Cancer Maternal Grandmother    Cancer Paternal Grandmother    Alcohol abuse Sister    Anxiety disorder Sister    Asthma Sister    Depression Sister    Diabetes Sister    Hypertension Sister      ROS: Denies fever, fatigue, unexplained  weight loss/gain, hearing or vision changes, cardiac or respiratory complaints. Denies neurological deficits, musculoskeletal complaints, gastrointestinal or genitourinary complaints, mental health complaints, and skin changes.   Objective:   Today's Vitals   02/15/24 0831  BP: 128/74  Pulse: 85  Temp: 98.6 F (37 C)  TempSrc: Temporal  SpO2: 98%  Weight: 279 lb 12.8 oz (126.9 kg)  Height: 6\' 4"  (1.93 m)    GENERAL APPEARANCE: Well-appearing, in NAD. Well nourished.  SKIN: Pink, warm and dry. Turgor normal. No rash, lesion, ulceration, or ecchymoses. Hair evenly distributed.  HEENT: HEAD: Normocephalic.  EYES: PERRLA. EOMI. Lids intact w/o defect. Sclera white, Conjunctiva pink w/o exudate.  EARS: External ear w/o redness, swelling, masses or lesions. EAC clear. TM's intact, translucent w/o bulging, appropriate landmarks visualized. Appropriate acuity to conversational tones.  NOSE: Septum midline w/o deformity. Nares patent, mucosa pink and non-inflamed w/o drainage.  THROAT: Uvula midline. Oropharynx clear. Tonsils non-inflamed w/o exudate. Oral mucosa pink and moist.  NECK: Supple, Trachea midline. Full ROM w/o pain or tenderness. No lymphadenopathy. Thyroid non-tender w/o enlargement or palpable masses.  RESPIRATORY: Chest wall symmetrical w/o masses. Respirations even and non-labored. Breath sounds clear to auscultation bilaterally. No wheezes, rales, rhonchi, or crackles. CARDIAC: S1, S2 present, regular rate and rhythm. No gallops, murmurs, rubs, or clicks. Capillary refill <2 seconds. Peripheral pulses 2+  bilaterally. GI: Abdomen soft w/o distention. Normoactive bowel sounds. No palpable masses or tenderness. No guarding or rebound tenderness. Liver and spleen w/o tenderness or enlargement. No CVA tenderness.  GU:  deferred exam. MSK: Muscle tone and strength appropriate for age, w/o atrophy or abnormal movement. EXTREMITIES: Active ROM intact, w/o tenderness, crepitus, or contracture. No obvious joint deformities or effusions. No clubbing, edema, or cyanosis.  NEUROLOGIC: CN's II-XII intact. Motor strength symmetrical with no obvious weakness. No sensory deficits.Steady, even gait.  PSYCH/MENTAL STATUS: Alert, oriented x 3. Cooperative, appropriate mood and affect.   Assessment & Plan:  Encounter for general adult medical examination without abnormal findings -     CBC with Differential/Platelet -     Comprehensive metabolic panel with GFR -     TSH -     Lipid panel  Encounter for screening for HIV -     HIV Antibody (routine testing w rflx)  Need for hepatitis C screening test -     Hepatitis C antibody     Orders Placed This Encounter  Procedures   CBC with Differential/Platelet   Comprehensive metabolic panel with GFR   TSH   Lipid panel   Hepatitis C antibody   HIV antibody (with reflex)    PATIENT COUNSELING: - Encouraged to adjust caloric intake to maintain or achieve ideal body weight, to reduce intake of dietary saturated fat and total fat, to limit sodium intake by avoiding high sodium foods and not adding table salt, and to maintain adequate dietary potassium and calcium preferably from fresh fruits, vegetables, and low-fat dairy products.   - Advised to avoid cigarette smoking. - Discussed with the patient that most people either abstain from alcohol or drink within safe limits (<=14/week and <=4 drinks/occasion for males, <=7/weeks and <= 3 drinks/occasion for females) and that the risk for alcohol disorders and other health effects rises proportionally with the  number of drinks per week and how often a drinker exceeds daily limits. - Discussed cessation/primary prevention of drug use and availability of treatment for abuse.   - Stressed the importance of regular exercise - Injury prevention: Discussed safety belts,  safety helmets, smoke detector, smoking near bedding or upholstery.  - Sexuality: Discussed sexually transmitted diseases, partner selection, use of condoms, avoidance of unintended pregnancy  and contraceptive alternatives.   NEXT PREVENTATIVE PHYSICAL DUE IN 1 YEAR.  Return in about 1 year (around 02/14/2025) for Annual Physical Exam with fasting lab work.  Gavin Kast, FNP

## 2024-02-16 LAB — HIV ANTIBODY (ROUTINE TESTING W REFLEX): HIV 1&2 Ab, 4th Generation: NONREACTIVE

## 2024-02-16 LAB — HEPATITIS C ANTIBODY: Hepatitis C Ab: NONREACTIVE

## 2024-02-17 ENCOUNTER — Ambulatory Visit: Payer: Self-pay | Admitting: Internal Medicine

## 2024-02-17 DIAGNOSIS — R7989 Other specified abnormal findings of blood chemistry: Secondary | ICD-10-CM

## 2024-02-18 LAB — TSH: TSH: 0.24 u[IU]/mL — ABNORMAL LOW (ref 0.35–5.50)

## 2024-02-19 ENCOUNTER — Telehealth: Payer: Self-pay

## 2024-02-19 NOTE — Telephone Encounter (Signed)
 Results have been relayed to the patient. The patient verbalized understanding. No questions at this time.  Lab appt scheduled  Copied from CRM [id number]. Topic: General - Other >> Feb 19, 2024  1:22 PM [place] A wrote: Reason for CRM: Patient was returning call from [facility] call patient back

## 2024-04-04 ENCOUNTER — Other Ambulatory Visit

## 2024-04-04 ENCOUNTER — Ambulatory Visit: Admitting: Internal Medicine

## 2025-02-16 ENCOUNTER — Encounter: Admitting: Internal Medicine
